# Patient Record
Sex: Female | Born: 1945
Health system: Southern US, Community
[De-identification: ages and names within clinical notes are randomized; demographics above are authoritative.]

## PROBLEM LIST (undated history)

## (undated) DIAGNOSIS — M1990 Unspecified osteoarthritis, unspecified site: Secondary | ICD-10-CM

## (undated) DIAGNOSIS — E785 Hyperlipidemia, unspecified: Secondary | ICD-10-CM

## (undated) DIAGNOSIS — E039 Hypothyroidism, unspecified: Secondary | ICD-10-CM

## (undated) DIAGNOSIS — K219 Gastro-esophageal reflux disease without esophagitis: Secondary | ICD-10-CM

## (undated) DIAGNOSIS — H269 Unspecified cataract: Secondary | ICD-10-CM

## (undated) DIAGNOSIS — Z860100 Personal history of colon polyps, unspecified: Secondary | ICD-10-CM

## (undated) DIAGNOSIS — R0602 Shortness of breath: Secondary | ICD-10-CM

## (undated) DIAGNOSIS — I10 Essential (primary) hypertension: Secondary | ICD-10-CM

## (undated) HISTORY — DX: Unspecified cataract: H26.9

## (undated) HISTORY — PX: FOOT NEUROMA SURGERY: SHX646

## (undated) HISTORY — DX: Hyperlipidemia, unspecified: E78.5

## (undated) HISTORY — DX: Personal history of colon polyps, unspecified: Z86.0100

## (undated) HISTORY — PX: TUBAL LIGATION: SHX77

## (undated) HISTORY — PX: FRACTURE SURGERY: SHX138

## (undated) HISTORY — PX: CHOLECYSTECTOMY: SHX55

## (undated) HISTORY — DX: Personal history of colonic polyps: Z86.010

---

## 1997-09-29 ENCOUNTER — Other Ambulatory Visit: Admission: RE | Admit: 1997-09-29 | Discharge: 1997-09-29 | Payer: Self-pay | Admitting: Gynecology

## 2000-04-18 ENCOUNTER — Ambulatory Visit (HOSPITAL_BASED_OUTPATIENT_CLINIC_OR_DEPARTMENT_OTHER): Admission: RE | Admit: 2000-04-18 | Discharge: 2000-04-18 | Payer: Self-pay | Admitting: General Surgery

## 2000-04-18 ENCOUNTER — Encounter (INDEPENDENT_AMBULATORY_CARE_PROVIDER_SITE_OTHER): Payer: Self-pay | Admitting: Specialist

## 2000-11-06 ENCOUNTER — Other Ambulatory Visit: Admission: RE | Admit: 2000-11-06 | Discharge: 2000-11-06 | Payer: Self-pay | Admitting: Gynecology

## 2001-07-30 ENCOUNTER — Emergency Department (HOSPITAL_COMMUNITY): Admission: EM | Admit: 2001-07-30 | Discharge: 2001-07-30 | Payer: Self-pay | Admitting: Emergency Medicine

## 2004-02-17 ENCOUNTER — Ambulatory Visit (HOSPITAL_COMMUNITY): Admission: RE | Admit: 2004-02-17 | Discharge: 2004-02-17 | Payer: Self-pay | Admitting: Chiropractic Medicine

## 2005-10-08 ENCOUNTER — Encounter (INDEPENDENT_AMBULATORY_CARE_PROVIDER_SITE_OTHER): Payer: Self-pay | Admitting: *Deleted

## 2005-11-18 ENCOUNTER — Ambulatory Visit: Payer: Self-pay | Admitting: Internal Medicine

## 2005-12-02 ENCOUNTER — Encounter (INDEPENDENT_AMBULATORY_CARE_PROVIDER_SITE_OTHER): Payer: Self-pay | Admitting: *Deleted

## 2005-12-02 ENCOUNTER — Ambulatory Visit: Payer: Self-pay | Admitting: Internal Medicine

## 2008-12-31 ENCOUNTER — Ambulatory Visit: Payer: Self-pay | Admitting: Internal Medicine

## 2008-12-31 DIAGNOSIS — K219 Gastro-esophageal reflux disease without esophagitis: Secondary | ICD-10-CM

## 2008-12-31 DIAGNOSIS — E669 Obesity, unspecified: Secondary | ICD-10-CM

## 2008-12-31 DIAGNOSIS — K589 Irritable bowel syndrome without diarrhea: Secondary | ICD-10-CM | POA: Insufficient documentation

## 2008-12-31 LAB — CONVERTED CEMR LAB: Tissue Transglutaminase Ab, IgA: 0.5 units (ref <7)

## 2009-01-07 ENCOUNTER — Ambulatory Visit: Payer: Self-pay | Admitting: Internal Medicine

## 2009-01-07 ENCOUNTER — Encounter: Payer: Self-pay | Admitting: Internal Medicine

## 2009-01-09 ENCOUNTER — Encounter: Payer: Self-pay | Admitting: Internal Medicine

## 2009-01-13 ENCOUNTER — Encounter: Payer: Self-pay | Admitting: Internal Medicine

## 2009-02-18 ENCOUNTER — Ambulatory Visit: Payer: Self-pay | Admitting: Internal Medicine

## 2009-02-18 DIAGNOSIS — Z860100 Personal history of colon polyps, unspecified: Secondary | ICD-10-CM | POA: Insufficient documentation

## 2010-12-29 ENCOUNTER — Encounter: Payer: Self-pay | Admitting: Internal Medicine

## 2011-04-25 ENCOUNTER — Other Ambulatory Visit: Payer: Self-pay | Admitting: Chiropractor

## 2011-04-25 DIAGNOSIS — M545 Low back pain: Secondary | ICD-10-CM

## 2011-04-26 ENCOUNTER — Ambulatory Visit
Admission: RE | Admit: 2011-04-26 | Discharge: 2011-04-26 | Disposition: A | Discharge status: Home / Self Care | Payer: BC Managed Care – PPO | Source: Ambulatory Visit | Attending: Chiropractor | Admitting: Chiropractor

## 2011-04-26 DIAGNOSIS — M545 Low back pain, unspecified: Secondary | ICD-10-CM

## 2011-06-22 ENCOUNTER — Encounter (HOSPITAL_COMMUNITY): Payer: Self-pay | Admitting: Pharmacy Technician

## 2011-06-27 ENCOUNTER — Other Ambulatory Visit: Payer: Self-pay

## 2011-06-27 ENCOUNTER — Encounter (HOSPITAL_COMMUNITY)
Admission: RE | Admit: 2011-06-27 | Discharge: 2011-06-27 | Disposition: A | Discharge status: Home / Self Care | Payer: BC Managed Care – PPO | Source: Ambulatory Visit | Attending: Orthopaedic Surgery | Admitting: Orthopaedic Surgery

## 2011-06-27 ENCOUNTER — Other Ambulatory Visit (HOSPITAL_COMMUNITY): Payer: Self-pay | Admitting: Orthopaedic Surgery

## 2011-06-27 ENCOUNTER — Encounter (HOSPITAL_COMMUNITY): Payer: Self-pay

## 2011-06-27 ENCOUNTER — Encounter (HOSPITAL_COMMUNITY)
Admission: RE | Admit: 2011-06-27 | Discharge: 2011-06-27 | Disposition: A | Discharge status: Home / Self Care | Payer: BC Managed Care – PPO | Source: Ambulatory Visit | Attending: Anesthesiology | Admitting: Anesthesiology

## 2011-06-27 HISTORY — DX: Gastro-esophageal reflux disease without esophagitis: K21.9

## 2011-06-27 HISTORY — DX: Shortness of breath: R06.02

## 2011-06-27 HISTORY — DX: Hypothyroidism, unspecified: E03.9

## 2011-06-27 HISTORY — DX: Unspecified osteoarthritis, unspecified site: M19.90

## 2011-06-27 HISTORY — DX: Essential (primary) hypertension: I10

## 2011-06-27 LAB — BASIC METABOLIC PANEL
BUN: 16 mg/dL (ref 6–23)
CO2: 26 mEq/L (ref 19–32)
Chloride: 103 mEq/L (ref 96–112)
Creatinine, Ser: 0.89 mg/dL (ref 0.50–1.10)
Sodium: 142 mEq/L (ref 135–145)

## 2011-06-27 LAB — SURGICAL PCR SCREEN: Staphylococcus aureus: POSITIVE — AB

## 2011-06-27 LAB — CBC
Hemoglobin: 14.8 g/dL (ref 12.0–15.0)
MCV: 87.8 fL (ref 78.0–100.0)
WBC: 8.7 10*3/uL (ref 4.0–10.5)

## 2011-06-27 NOTE — Pre-Procedure Instructions (Signed)
20 CHRISTENE POUNDS  06/27/2011   Your procedure is scheduled on:  06/29/2011  Report to [facility] at 11:15 AM.  Call this number if you have problems the morning of surgery: [phone]   Remember:   Do not eat food:After Midnight.  May have clear liquids: up to 4 Hours before arrival.  Clear liquids include soda, tea, black coffee, apple or grape juice, broth.  Take these medicines the morning of surgery with A SIP OF WATER:   SYNTHROID & PRILOSEC   Do not wear jewelry, make-up or nail polish.  Do not wear lotions, powders, or perfumes. You may wear deodorant.  Do not shave 48 hours prior to surgery.  Do not bring valuables to the hospital.  Contacts, dentures or bridgework may not be worn into surgery.  Leave suitcase in the car. After surgery it may be brought to your room.  For patients admitted to the hospital, checkout time is 11:00 AM the day of discharge.   Patients discharged the day of surgery will not be allowed to drive home.  Name and phone number of your driver:       With SPOUSE  Special Instructions: CHG Shower Use Special Wash: 1/2 bottle night before surgery and 1/2 bottle morning of surgery.   Please read over the following fact sheets that you were given: Pain Booklet, Coughing and Deep Breathing, MRSA Information and Surgical Site Infection Prevention

## 2011-06-27 NOTE — Progress Notes (Signed)
Pt. Reports that she might have had an ekg at [facility] In [place] in the past 2 yrs.

## 2011-06-27 NOTE — Progress Notes (Signed)
Requested ekg fr. [facility] - done 2011

## 2011-06-28 MED ORDER — CEFAZOLIN SODIUM-DEXTROSE 2-3 GM-% IV SOLR
2.0000 g | INTRAVENOUS | Status: AC
  Administered 2011-06-29: 2 g via INTRAVENOUS
  Filled 2011-06-28: qty 50

## 2011-06-28 NOTE — Consult Note (Signed)
Anesthesia:  Patient is a 66 year old female scheduled for a right L5-S1 microdiskectomy on 06/29/11.  History includes HTN, hypothyroidism, GERD, arthritis.  Non-smoker.  Labs and CXR acceptable.  EKG from 06/27/11 shows NSR, LAD, low voltage QRS, possible anterolateral infarct (age undetermined). We received a comparison EKG from [facility] in [place] from 04/01/10.  Overall, I think the EKG is stable.  No CV symptoms were reported at PAT.   I do not see that she has had any prior cardiac testing.   If no new symptoms on the day of surgery then anticipate she can proceed.  Reviewed with Anesthesiologist Dr. Noreene Larsson who agrees.

## 2011-06-29 ENCOUNTER — Encounter (HOSPITAL_COMMUNITY): Payer: Self-pay | Admitting: *Deleted

## 2011-06-29 ENCOUNTER — Ambulatory Visit (HOSPITAL_COMMUNITY)
Admission: RE | Admit: 2011-06-29 | Discharge: 2011-06-30 | Disposition: A | Discharge status: Home / Self Care | Payer: BC Managed Care – PPO | Source: Ambulatory Visit | Attending: Orthopaedic Surgery | Admitting: Orthopaedic Surgery

## 2011-06-29 ENCOUNTER — Ambulatory Visit (HOSPITAL_COMMUNITY): Payer: BC Managed Care – PPO

## 2011-06-29 ENCOUNTER — Ambulatory Visit (HOSPITAL_COMMUNITY): Payer: BC Managed Care – PPO | Admitting: Vascular Surgery

## 2011-06-29 ENCOUNTER — Encounter (HOSPITAL_COMMUNITY)
Admission: RE | Disposition: A | Discharge status: Home / Self Care | Payer: Self-pay | Source: Ambulatory Visit | Attending: Orthopaedic Surgery

## 2011-06-29 ENCOUNTER — Encounter (HOSPITAL_COMMUNITY): Payer: Self-pay | Admitting: Vascular Surgery

## 2011-06-29 DIAGNOSIS — Z01818 Encounter for other preprocedural examination: Secondary | ICD-10-CM | POA: Insufficient documentation

## 2011-06-29 DIAGNOSIS — I10 Essential (primary) hypertension: Secondary | ICD-10-CM | POA: Insufficient documentation

## 2011-06-29 DIAGNOSIS — Z01812 Encounter for preprocedural laboratory examination: Secondary | ICD-10-CM | POA: Insufficient documentation

## 2011-06-29 DIAGNOSIS — Z7982 Long term (current) use of aspirin: Secondary | ICD-10-CM | POA: Insufficient documentation

## 2011-06-29 DIAGNOSIS — Z79899 Other long term (current) drug therapy: Secondary | ICD-10-CM | POA: Insufficient documentation

## 2011-06-29 DIAGNOSIS — K219 Gastro-esophageal reflux disease without esophagitis: Secondary | ICD-10-CM | POA: Insufficient documentation

## 2011-06-29 DIAGNOSIS — M5126 Other intervertebral disc displacement, lumbar region: Secondary | ICD-10-CM | POA: Diagnosis present

## 2011-06-29 DIAGNOSIS — M4120 Other idiopathic scoliosis, site unspecified: Secondary | ICD-10-CM | POA: Insufficient documentation

## 2011-06-29 DIAGNOSIS — R0602 Shortness of breath: Secondary | ICD-10-CM | POA: Insufficient documentation

## 2011-06-29 DIAGNOSIS — Z01810 Encounter for preprocedural cardiovascular examination: Secondary | ICD-10-CM | POA: Insufficient documentation

## 2011-06-29 DIAGNOSIS — E039 Hypothyroidism, unspecified: Secondary | ICD-10-CM | POA: Insufficient documentation

## 2011-06-29 HISTORY — PX: LUMBAR LAMINECTOMY: SHX95

## 2011-06-29 SURGERY — MICRODISCECTOMY LUMBAR LAMINECTOMY
Anesthesia: General | Site: Back | Laterality: Right | Wound class: Clean

## 2011-06-29 MED ORDER — KETOROLAC TROMETHAMINE 30 MG/ML IJ SOLN
INTRAMUSCULAR | Status: AC
  Filled 2011-06-29: qty 1

## 2011-06-29 MED ORDER — DOCUSATE SODIUM 100 MG PO CAPS
100.0000 mg | ORAL_CAPSULE | Freq: Two times a day (BID) | ORAL | Status: DC
  Administered 2011-06-29: 100 mg via ORAL
  Filled 2011-06-29 (×3): qty 1

## 2011-06-29 MED ORDER — HYDROCODONE-ACETAMINOPHEN 5-325 MG PO TABS: 1.0000 | ORAL_TABLET | ORAL | Status: DC | PRN

## 2011-06-29 MED ORDER — MIDAZOLAM HCL 5 MG/5ML IJ SOLN
INTRAMUSCULAR | Status: DC | PRN
  Administered 2011-06-29: 2 mg via INTRAVENOUS

## 2011-06-29 MED ORDER — OXYCODONE-ACETAMINOPHEN 5-325 MG PO TABS
1.0000 | ORAL_TABLET | ORAL | Status: DC | PRN
  Administered 2011-06-29 – 2011-06-30 (×3): 2 via ORAL
  Filled 2011-06-29 (×3): qty 2

## 2011-06-29 MED ORDER — LEVOTHYROXINE SODIUM 50 MCG PO TABS
50.0000 ug | ORAL_TABLET | Freq: Every day | ORAL | Status: DC
  Filled 2011-06-29 (×2): qty 1

## 2011-06-29 MED ORDER — SENNOSIDES-DOCUSATE SODIUM 8.6-50 MG PO TABS: 1.0000 | ORAL_TABLET | Freq: Every evening | ORAL | Status: DC | PRN

## 2011-06-29 MED ORDER — KETOROLAC TROMETHAMINE 30 MG/ML IJ SOLN
30.0000 mg | Freq: Three times a day (TID) | INTRAMUSCULAR | Status: AC
  Administered 2011-06-29 – 2011-06-30 (×3): 30 mg via INTRAVENOUS
  Filled 2011-06-29 (×5): qty 1

## 2011-06-29 MED ORDER — METHOCARBAMOL 500 MG PO TABS
500.0000 mg | ORAL_TABLET | Freq: Four times a day (QID) | ORAL | Status: DC | PRN
  Administered 2011-06-29 – 2011-06-30 (×2): 500 mg via ORAL
  Filled 2011-06-29 (×2): qty 1

## 2011-06-29 MED ORDER — NEOSTIGMINE METHYLSULFATE 1 MG/ML IJ SOLN
INTRAMUSCULAR | Status: DC | PRN
  Administered 2011-06-29: 3 mg via INTRAVENOUS

## 2011-06-29 MED ORDER — KCL IN DEXTROSE-NACL 20-5-0.45 MEQ/L-%-% IV SOLN
INTRAVENOUS | Status: DC
  Administered 2011-06-30: 01:00:00 via INTRAVENOUS
  Filled 2011-06-29 (×3): qty 1000

## 2011-06-29 MED ORDER — PHENOL 1.4 % MT LIQD
1.0000 | OROMUCOSAL | Status: DC | PRN
  Filled 2011-06-29: qty 177

## 2011-06-29 MED ORDER — MORPHINE SULFATE 4 MG/ML IJ SOLN: 0.0500 mg/kg | INTRAMUSCULAR | Status: DC | PRN

## 2011-06-29 MED ORDER — FENTANYL CITRATE 0.05 MG/ML IJ SOLN: 50.0000 ug | INTRAMUSCULAR | Status: DC | PRN

## 2011-06-29 MED ORDER — MORPHINE SULFATE 2 MG/ML IJ SOLN: 0.0500 mg/kg | INTRAMUSCULAR | Status: DC | PRN

## 2011-06-29 MED ORDER — ONDANSETRON HCL 4 MG/2ML IJ SOLN: 4.0000 mg | INTRAMUSCULAR | Status: DC | PRN

## 2011-06-29 MED ORDER — GLYCOPYRROLATE 0.2 MG/ML IJ SOLN
INTRAMUSCULAR | Status: DC | PRN
  Administered 2011-06-29: .5 mg via INTRAVENOUS

## 2011-06-29 MED ORDER — BISACODYL 10 MG RE SUPP: 10.0000 mg | Freq: Every day | RECTAL | Status: DC | PRN

## 2011-06-29 MED ORDER — ACETAMINOPHEN 650 MG RE SUPP: 650.0000 mg | RECTAL | Status: DC | PRN

## 2011-06-29 MED ORDER — ASPIRIN EC 81 MG PO TBEC
81.0000 mg | DELAYED_RELEASE_TABLET | Freq: Every day | ORAL | Status: DC
  Administered 2011-06-29: 81 mg via ORAL
  Filled 2011-06-29 (×2): qty 1

## 2011-06-29 MED ORDER — ZOLPIDEM TARTRATE 5 MG PO TABS: 5.0000 mg | ORAL_TABLET | Freq: Every evening | ORAL | Status: DC | PRN

## 2011-06-29 MED ORDER — MENTHOL 3 MG MT LOZG: 1.0000 | LOZENGE | OROMUCOSAL | Status: DC | PRN

## 2011-06-29 MED ORDER — SODIUM CHLORIDE 0.9 % IJ SOLN
3.0000 mL | INTRAMUSCULAR | Status: DC | PRN
  Administered 2011-06-30: 3 mL via INTRAVENOUS

## 2011-06-29 MED ORDER — CEFAZOLIN SODIUM 1-5 GM-% IV SOLN
1.0000 g | Freq: Three times a day (TID) | INTRAVENOUS | Status: AC
  Administered 2011-06-30 (×2): 1 g via INTRAVENOUS
  Filled 2011-06-29 (×2): qty 50

## 2011-06-29 MED ORDER — SODIUM CHLORIDE 0.9 % IJ SOLN: 3.0000 mL | Freq: Two times a day (BID) | INTRAMUSCULAR | Status: DC

## 2011-06-29 MED ORDER — BENAZEPRIL HCL 10 MG PO TABS
10.0000 mg | ORAL_TABLET | Freq: Every day | ORAL | Status: DC
  Filled 2011-06-29: qty 1

## 2011-06-29 MED ORDER — ONDANSETRON HCL 4 MG/2ML IJ SOLN
INTRAMUSCULAR | Status: DC | PRN
  Administered 2011-06-29: 4 mg via INTRAVENOUS

## 2011-06-29 MED ORDER — HYDROMORPHONE HCL PF 1 MG/ML IJ SOLN
INTRAMUSCULAR | Status: AC
  Administered 2011-06-29: 0.5 mg via INTRAVENOUS
  Filled 2011-06-29: qty 1

## 2011-06-29 MED ORDER — FENTANYL CITRATE 0.05 MG/ML IJ SOLN
INTRAMUSCULAR | Status: AC
  Filled 2011-06-29: qty 2

## 2011-06-29 MED ORDER — MEPERIDINE HCL 25 MG/ML IJ SOLN: 6.2500 mg | INTRAMUSCULAR | Status: DC | PRN

## 2011-06-29 MED ORDER — PROPOFOL 10 MG/ML IV EMUL
INTRAVENOUS | Status: DC | PRN
  Administered 2011-06-29: 200 mg via INTRAVENOUS

## 2011-06-29 MED ORDER — PROMETHAZINE HCL 25 MG/ML IJ SOLN
INTRAMUSCULAR | Status: AC
  Administered 2011-06-29: 12.5 mg
  Filled 2011-06-29: qty 1

## 2011-06-29 MED ORDER — DEXTROSE 5 % IV SOLN
INTRAVENOUS | Status: DC | PRN
  Administered 2011-06-29: 13:00:00 via INTRAVENOUS

## 2011-06-29 MED ORDER — LACTATED RINGERS IV SOLN
INTRAVENOUS | Status: DC
  Administered 2011-06-29: 13:00:00 via INTRAVENOUS

## 2011-06-29 MED ORDER — ALUM & MAG HYDROXIDE-SIMETH 200-200-20 MG/5ML PO SUSP: 30.0000 mL | Freq: Four times a day (QID) | ORAL | Status: DC | PRN

## 2011-06-29 MED ORDER — ONDANSETRON HCL 4 MG/2ML IJ SOLN
INTRAMUSCULAR | Status: AC
  Filled 2011-06-29: qty 2

## 2011-06-29 MED ORDER — ONDANSETRON HCL 4 MG/2ML IJ SOLN
4.0000 mg | Freq: Once | INTRAMUSCULAR | Status: AC | PRN
  Administered 2011-06-29: 4 mg via INTRAVENOUS

## 2011-06-29 MED ORDER — ROCURONIUM BROMIDE 100 MG/10ML IV SOLN
INTRAVENOUS | Status: DC | PRN
  Administered 2011-06-29: 50 mg via INTRAVENOUS

## 2011-06-29 MED ORDER — SODIUM CHLORIDE 0.9 % IV SOLN: 250.0000 mL | INTRAVENOUS | Status: DC

## 2011-06-29 MED ORDER — MORPHINE SULFATE 4 MG/ML IJ SOLN
1.0000 mg | INTRAMUSCULAR | Status: DC | PRN
  Administered 2011-06-30: 2 mg via INTRAVENOUS
  Filled 2011-06-29: qty 1

## 2011-06-29 MED ORDER — AMLODIPINE BESYLATE 5 MG PO TABS
5.0000 mg | ORAL_TABLET | Freq: Every day | ORAL | Status: DC
  Filled 2011-06-29: qty 1

## 2011-06-29 MED ORDER — LACTATED RINGERS IV SOLN
INTRAVENOUS | Status: DC | PRN
  Administered 2011-06-29 (×2): via INTRAVENOUS

## 2011-06-29 MED ORDER — PANTOPRAZOLE SODIUM 40 MG PO TBEC
40.0000 mg | DELAYED_RELEASE_TABLET | Freq: Every day | ORAL | Status: DC
  Administered 2011-06-29: 40 mg via ORAL
  Filled 2011-06-29: qty 1

## 2011-06-29 MED ORDER — SUFENTANIL CITRATE 50 MCG/ML IV SOLN
INTRAVENOUS | Status: DC | PRN
  Administered 2011-06-29: 20 ug via INTRAVENOUS
  Administered 2011-06-29: 10 ug via INTRAVENOUS

## 2011-06-29 MED ORDER — HYDROMORPHONE HCL PF 1 MG/ML IJ SOLN
0.2500 mg | INTRAMUSCULAR | Status: DC | PRN
Start: 2011-06-29 — End: 2011-06-29
  Administered 2011-06-29 (×4): 0.5 mg via INTRAVENOUS

## 2011-06-29 MED ORDER — METHOCARBAMOL 100 MG/ML IJ SOLN
500.0000 mg | Freq: Four times a day (QID) | INTRAVENOUS | Status: DC | PRN
  Filled 2011-06-29: qty 5

## 2011-06-29 MED ORDER — FENTANYL CITRATE 0.05 MG/ML IJ SOLN
50.0000 ug | INTRAMUSCULAR | Status: DC | PRN
  Administered 2011-06-29: 100 ug via INTRAVENOUS

## 2011-06-29 MED ORDER — ACETAMINOPHEN 325 MG PO TABS: 650.0000 mg | ORAL_TABLET | ORAL | Status: DC | PRN

## 2011-06-29 MED ORDER — AMLODIPINE BESY-BENAZEPRIL HCL 5-10 MG PO CAPS: 1.0000 | ORAL_CAPSULE | ORAL | Status: DC

## 2011-06-29 SURGICAL SUPPLY — 46 items
BENZOIN TINCTURE PRP APPL 2/3 (GAUZE/BANDAGES/DRESSINGS) ×2 IMPLANT
BUR ROUND FLUTED 4 SOFT TCH (BURR) IMPLANT
CLOSURE STERI STRIP 1/2 X4 (GAUZE/BANDAGES/DRESSINGS) ×2 IMPLANT
CLOTH BEACON ORANGE TIMEOUT ST (SAFETY) ×2 IMPLANT
CORDS BIPOLAR (ELECTRODE) ×2 IMPLANT
COVER SURGICAL LIGHT HANDLE (MISCELLANEOUS) ×2 IMPLANT
DRAPE MICROSCOPE LEICA (MISCELLANEOUS) ×2 IMPLANT
DRAPE PROXIMA HALF (DRAPES) ×4 IMPLANT
DRSG EMULSION OIL 3X3 NADH (GAUZE/BANDAGES/DRESSINGS) ×2 IMPLANT
DURAPREP 26ML APPLICATOR (WOUND CARE) ×2 IMPLANT
ELECT REM PT RETURN 9FT ADLT (ELECTROSURGICAL) ×2
ELECTRODE REM PT RTRN 9FT ADLT (ELECTROSURGICAL) ×1 IMPLANT
GLOVE BIOGEL PI IND STRL 7.5 (GLOVE) ×1 IMPLANT
GLOVE BIOGEL PI IND STRL 8 (GLOVE) ×1 IMPLANT
GLOVE BIOGEL PI INDICATOR 7.5 (GLOVE) ×1
GLOVE BIOGEL PI INDICATOR 8 (GLOVE) ×1
GLOVE ECLIPSE 7.0 STRL STRAW (GLOVE) ×2 IMPLANT
GLOVE ORTHO TXT STRL SZ7.5 (GLOVE) ×2 IMPLANT
GOWN PREVENTION PLUS LG XLONG (DISPOSABLE) IMPLANT
GOWN STRL NON-REIN LRG LVL3 (GOWN DISPOSABLE) ×6 IMPLANT
KIT BASIN OR (CUSTOM PROCEDURE TRAY) ×2 IMPLANT
KIT ROOM TURNOVER OR (KITS) ×2 IMPLANT
MANIFOLD NEPTUNE II (INSTRUMENTS) ×2 IMPLANT
NDL SUT .5 MAYO 1.404X.05X (NEEDLE) ×1 IMPLANT
NEEDLE HYPO 25GX1X1/2 BEV (NEEDLE) ×2 IMPLANT
NEEDLE MAYO TAPER (NEEDLE) ×1
NEEDLE SPNL 18GX3.5 QUINCKE PK (NEEDLE) ×2 IMPLANT
NS IRRIG 1000ML POUR BTL (IV SOLUTION) ×2 IMPLANT
PACK LAMINECTOMY ORTHO (CUSTOM PROCEDURE TRAY) ×2 IMPLANT
PAD ARMBOARD 7.5X6 YLW CONV (MISCELLANEOUS) ×4 IMPLANT
PATTIES SURGICAL .5 X.5 (GAUZE/BANDAGES/DRESSINGS) ×2 IMPLANT
PATTIES SURGICAL .75X.75 (GAUZE/BANDAGES/DRESSINGS) IMPLANT
SPONGE GAUZE 4X4 12PLY (GAUZE/BANDAGES/DRESSINGS) ×2 IMPLANT
STAPLER VISISTAT 35W (STAPLE) IMPLANT
STRIP CLOSURE SKIN 1/2X4 (GAUZE/BANDAGES/DRESSINGS) IMPLANT
SUT VIC AB 2-0 CT1 27 (SUTURE) ×1
SUT VIC AB 2-0 CT1 TAPERPNT 27 (SUTURE) ×1 IMPLANT
SUT VICRYL 0 TIES 12 18 (SUTURE) ×2 IMPLANT
SUT VICRYL 4-0 PS2 18IN ABS (SUTURE) IMPLANT
SUT VICRYL AB 2 0 TIES (SUTURE) ×2 IMPLANT
SYR 20ML ECCENTRIC (SYRINGE) IMPLANT
SYR CONTROL 10ML LL (SYRINGE) ×2 IMPLANT
TAPE CLOTH SURG 4X10 WHT LF (GAUZE/BANDAGES/DRESSINGS) ×2 IMPLANT
TOWEL OR 17X24 6PK STRL BLUE (TOWEL DISPOSABLE) ×2 IMPLANT
TOWEL OR 17X26 10 PK STRL BLUE (TOWEL DISPOSABLE) ×2 IMPLANT
WATER STERILE IRR 1000ML POUR (IV SOLUTION) ×2 IMPLANT

## 2011-06-29 NOTE — Anesthesia Preprocedure Evaluation (Addendum)
Anesthesia Evaluation  Patient identified by MRN, date of birth, ID band Patient awake    Reviewed: Allergy & Precautions, H&P , NPO status , Patient's Chart, lab work & pertinent test results, reviewed documented beta blocker date and time   Airway Mallampati: I TM Distance: >3 FB     Dental  (+) Teeth Intact and Dental Advisory Given   Pulmonary  clear to auscultation        Cardiovascular Normal    Neuro/Psych    GI/Hepatic   Endo/Other    Renal/GU      Musculoskeletal   Abdominal   Peds  Hematology   Anesthesia Other Findings   Reproductive/Obstetrics                           Anesthesia Physical Anesthesia Plan  ASA: II  Anesthesia Plan: General   Post-op Pain Management:    Induction: Intravenous  Airway Management Planned: Oral ETT  Additional Equipment:   Intra-op Plan:   Post-operative Plan: Extubation in OR  Informed Consent: I have reviewed the patients History and Physical, chart, labs and discussed the procedure including the risks, benefits and alternatives for the proposed anesthesia with the patient or authorized representative who has indicated his/her understanding and acceptance.   Dental advisory given  Plan Discussed with: CRNA, Anesthesiologist and Surgeon  Anesthesia Plan Comments:         Anesthesia Quick Evaluation

## 2011-06-29 NOTE — Transfer of Care (Signed)
Immediate Anesthesia Transfer of Care Note  Patient: Cynthia Werner  Procedure(s) Performed:  MICRODISCECTOMY LUMBAR LAMINECTOMY - Right L5-S1 Microdiscectomy  Patient Location: PACU  Anesthesia Type: General  Level of Consciousness: awake, alert  and oriented  Airway & Oxygen Therapy: Patient Spontanous Breathing and Patient connected to nasal cannula oxygen  Post-op Assessment: Report given to PACU RN, Post -op Vital signs reviewed and stable and Patient moving all extremities  Post vital signs: Reviewed and stable  Complications: No apparent anesthesia complications

## 2011-06-29 NOTE — H&P (Signed)
A Division of [facility]  [address] [place], [place] [address] Telephone: [phone]  Fax: [phone]    PATIENT: Cynthia Werner, Cynthia Werner  MR#: [id number]  Visit Date: 06/21/2011    HISTORY OF PRESENT ILLNESS:  Patient referred by Dr. Alvester Morin for evaluation of back pain, right leg pain, right leg weakness and right leg giving away.  Patient states she has had significant problems since September.  She has had problems like this likely about 10 years ago, but was not as severe as what she is currently experiencing and it resolved.  She states she has always had some back pain with some dull aching.  She has had some known scoliosis.  She denies any problems with her back when she was a child, but states pretty much all of her adult life, she has had some aching in her back, but the pain she is having now is severe.  She has been seen by a couple of chiropractors.  She has had an MRI scan that shows focal disk herniation on the right at L5-S1 with L5 nerve root displacement.  She is not able to stand very long.  She has pain when she walks from one room to the other.  It takes 5-10 minutes of supine position for her to get better.  She has tried different medications, including prednisone, tramadol, she has had epidural steroid injections, ibuprofen, hydrocodone and states that nothing really helps.  She did feel like she could move slightly better for a period of time shortly after the injection.  Patient has pain that radiates down to the dorsum of her right foot, none on the left side.  She is normally followed by [facility], Lonie Peak, [place] and Dr. Nathanial Rancher, M.D.   CURRENT MEDICATIONS:  She is omeprazole 20 mg, amlodipine, benazepril 50/10, chlordiazepoxide 1 p.o. daily, 1 aspirin a day, omega 3 fatty acids and vitamin D3.   ALLERGIES:  She has no known drug allergies.   PAST MEDICAL/SURGICAL HISTORY:  Previous gallbladder surgery in the 1990s.   SOCIAL  HISTORY:  Patient is married to her husband, Cynthia Werner.  She does not smoke or drink.   FAMILY HISTORY:  Positive for diabetes, heart disease and hypertension.   REVIEW OF SYSTEMS:  Fourteen point review of systems positive for acid reflux and hypertension.   PHYSICAL EXAMINATION:  Patient has difficulty getting from sitting to standing position.  She is unable to toe walk.  She cannot do a single stance toe raise on the right.  She can do them on the left side.  Anterior tib is weak.  EHL is weak.  I am able to overcome both with 2 finger pressure on the right side.  On the left, EHL, anterior tib and gastrocsoleus test normal.  She has decreased sensation dorsum of her foot.  Positive straight leg raising at 90 degrees.  Patient is alert and oriented.  WD.  WN.  NAD.  Extraocular movements intact.  Good visual acuity with glasses.  No rash over exposed skin.  BP 134/60, pulse is 80.  No accessory muscle inspiratory effort.     RADIOGRAPHS/TEST:  MRI scan is reviewed with patient, which shows far lateral protrusion at L5-S1 with L5 nerve root compression.  MRI scan shows mild facet arthropathy up to 4-5 without compression.  At 5-1, shows far lateral protrusion on the right adjacent L5 nerve root.     ASSESSMENT/DIAGNOSIS:  L5-S1 right lateral HNP.  Patient states that  her life has been miserable.  Christmas and Thanksgiving are terrible.  She cannot stand.  She has times when she cannot stand on her own.  Someone has to hang onto her so that she does not fall.  I discussed with patient and her husband, looking at spine models and options.     PLAN:  Will obtain some plain radiographs AP, lateral and obliques.  They would like to proceed with operative intervention due to chronic pain that she is having, failed chiropractic treatment, exercise regimen, anti-inflammatories, prednisone, narcotic pain medication with leg giving away and recent falling episodes that she has had in the last 10 days.  Questions  answered and we discussed possibility of repeat surgery, midline exposure.  I gave the patient and her husband a copy of the MRI scan and report.  Failed epidurals and would like to proceed with operative intervention.  Patient has had greater than 6 months of symptoms with increasing weakness, falling, failed conservative treatment including chiropractic treatments with 2 different chiropractors, epidural steroid injections x2.  All questions answered.  Risks of surgery discussed including repeat disk rupture.   For additional information please see handwritten notes, reports, orders and prescriptions in this chart.      Jabron Weese C. Ophelia Charter, M.D.    Auto-Authenticated by Veverly Fells. Ophelia Charter, M.D.  MCY/ac DD: 06/21/2011  DT: 06/23/2011   cc: Jannifer Franklin. Alvester Morin, MD   Lonie Peak, PA-C([employer])

## 2011-06-29 NOTE — Interval H&P Note (Signed)
History and Physical Interval Note:  06/29/2011 12:51 PM  Cynthia Werner  has presented today for surgery, with the diagnosis of Right L5-S1 HNP  The various methods of treatment have been discussed with the patient and family. After consideration of risks, benefits and other options for treatment, the patient has consented to  Procedure(s): MICRODISCECTOMY LUMBAR LAMINECTOMY as a surgical intervention .  The patients' history has been reviewed, patient examined, no change in status, stable for surgery.  I have reviewed the patients' chart and labs.  Questions were answered to the patient's satisfaction.     YATES,MARK C  Lungs CTA, Heart RR and R.  No M or G.  Abd S/NT.   Right S1 weakness. Meds reviewed.

## 2011-06-29 NOTE — Brief Op Note (Signed)
06/29/2011  2:33 PM  PATIENT:  Cynthia Werner  66 y.o. female  PRE-OPERATIVE DIAGNOSIS:  Right L5-S1 herniated nulceus pulposus.  POST-OPERATIVE DIAGNOSIS:  Right L5-S1 herniated nulceus pulposus.  PROCEDURE:  Procedure(s): MICRODISCECTOMY LUMBAR LAMINECTOMY Right L5-S1 SURGEON:  Surgeon(s): Eldred Manges, MD  PHYSICIAN ASSISTANT: Maud Deed PAC  ASSISTANTS: none   ANESTHESIA:   general  EBL:  Total I/O In: 1050 [I.V.:1050] Out: -   BLOOD ADMINISTERED:none  DRAINS: none   LOCAL MEDICATIONS USED:  NONE  SPECIMEN:  No Specimen  DISPOSITION OF SPECIMEN:  N/A  COUNTS:  YES  TOURNIQUET:  * No tourniquets in log *  DICTATION: .Note written in EPIC  PLAN OF CARE: Admit for overnight observation  PATIENT DISPOSITION:  PACU - hemodynamically stable.   Delay start of Pharmacological VTE agent (>24hrs) due to surgical blood loss or risk of bleeding:  {YES/NO/NOT APPLICABLE:20182

## 2011-06-29 NOTE — Anesthesia Postprocedure Evaluation (Signed)
Anesthesia Post Note  Patient: Cynthia Werner  Procedure(s) Performed:  MICRODISCECTOMY LUMBAR LAMINECTOMY - Right L5-S1 Microdiscectomy  Anesthesia type: General  Patient location: PACU  Post pain: Pain level controlled and Adequate analgesia  Post assessment: Post-op Vital signs reviewed, Patient's Cardiovascular Status Stable, Respiratory Function Stable, Patent Airway and Pain level controlled  Last Vitals:  Filed Vitals:   06/29/11 1624  BP:   Pulse:   Temp: 36.2 C  Resp:     Post vital signs: Reviewed and stable  Level of consciousness: awake, alert  and oriented  Complications: No apparent anesthesia complications

## 2011-06-30 MED ORDER — METHOCARBAMOL 500 MG PO TABS: 500.0000 mg | ORAL_TABLET | Freq: Four times a day (QID) | ORAL | Status: AC | PRN

## 2011-06-30 MED ORDER — OXYCODONE-ACETAMINOPHEN 5-325 MG PO TABS: 1.0000 | ORAL_TABLET | ORAL | Status: AC | PRN

## 2011-06-30 NOTE — Op Note (Signed)
Cynthia Werner, Cynthia Werner               ACCOUNT NO.:  [id number]  MEDICAL RECORD NO.:  [id number]  LOCATION:  5033                         FACILITY:  [facility]  PHYSICIAN:  Shalay Carder C. Ophelia Charter, M.D.    DATE OF BIRTH:  01-Jan-1946  DATE OF PROCEDURE:  06/29/2011 DATE OF DISCHARGE:                              OPERATIVE REPORT   PREOPERATIVE DIAGNOSIS:  Right foraminal extraforaminal L5-S1 herniated nucleus pulposus.  POSTOPERATIVE DIAGNOSIS:  Right foraminal extraforaminal L5-S1 herniated nucleus pulposus.  PROCEDURE:  Right L5-S1 microdiskectomy, removal of herniated nucleus pulposus.  SURGEON:  Hansel Devan C. Ophelia Charter, MD  ANESTHESIA:  GOT plus Marcaine local.  ASSISTANT:  Wende Neighbors, PA-C, medically necessary and present for the entire procedure.  ESTIMATED BLOOD LOSS:  Minimal.  SPECIMEN:  None.  BRIEF HISTORY:  This patient has persistent problems with back pain, right leg pain, right leg weakness, gastrocsoleus weakness, failed conservative treatment including anti-inflammatories several months conservative treatment, and epidural steroid injections.  She has failed to respond epidurals, had progressive radiating pain S1 distribution with weakness and was referred to me by Dr. Naaman Plummer after failed epidurals for operative intervention.  The patient had nerve root compression laterally with far lateral L5-S1 HNP with L5 nerve root displacement.  PROCEDURE:  After induction of general anesthesia, orotracheal intubation, preoperative antibiotics, the patient was placed prone on chest rolls careful padding, shoulder pads, arms at 90-90 with pads over the ulnar nerve.  Back was prepped with DuraPrep.  Area squared with towels.  Betadine, Steri-Drape application and laminectomy sheet and drapes.  Needle was placed based on palpable landmarks.  Turned out to be just inferior to the L5-S1 interspace with cross table lateral.  A small incision was made, more cephalad and then 2-3 mm off  the midline on the right side.  Subperiosteal dissection out over the facet and the [facility] retractor placed laterally, 5 pounds weight.  Laminotomy was performed at L5-S1 interspace.  A #4 Penfield was placed and second x- ray was taken confirming this was the correct L5-S1 level.  Operative microscope was draped, brought in.  Ligamentum was removed. Foraminotomy was performed.  Bone was removed out to the level of the pedicle.  Disk was bulging slightly.  Annulus was incised.  Passes were made with micropituitary.  There was an empty space present with initially reaching into the disk.  Minimal disk material was present. Hockey stick was used to push out laterally underneath the lateral ligament, pushing down the chunks of disks were pushed in the middle of the disk and they were removed with a straight micropituitary straight, down micropituitary, down regular and up from the opposite side. Micropituitary was used by assistant, removing chunks of disk out as the nerve root and dura were carefully protected with a [employer] retractor. Nerve root was decompressed.  No fragments were down in the foramina. Passed the hockey stick into the dura gave 180 degrees sweep with no areas of compression.  Continued pushing with longest hockey stick pushing fragments down, delivering them to help decompress laterally out past the foramina.  Some additional bone was removed with partial facetectomy to allow a little bit further insertion with the hockey  stick to push the fragment away from the nerve root that was being displaced.  After adequate decompression, irrigation with saline solution, closure of fascia with 0 Vicryl, 2-0 Vicryl subcutaneous tissue, subcuticular closure.  Tincture of benzoin, Marcaine infiltration, Steri-Strips, 4x4s, and postop dressing.  Instrument count and needle count were correct.  The patient tolerated the procedure well and transferred to recovery room in stable  condition.     Hadassa Cermak C. Ophelia Charter, M.D.     MCY/MEDQ  D:  06/29/2011  T:  06/30/2011  Job:  [id number]

## 2011-06-30 NOTE — Progress Notes (Addendum)
Subjective: Tolerating po pain meds.  Out of bed to bathroom. Some back pain as expected.  Leg pain some improved.  Eating and voiding   Objective: Vital signs in last 24 hours: Temp:  [96.9 F (36.1 C)-98.4 F (36.9 C)] 97.9 F (36.6 C) (01/31 0240) Pulse Rate:  [54-73] 54  (01/31 0240) Resp:  [16-31] 20  (01/31 0240) BP: (103-136)/(39-80) 117/66 mmHg (01/31 0240) SpO2:  [92 %-99 %] 94 % (01/31 0240) Weight:  [83.915 kg (185 lb)] 83.915 kg (185 lb) (01/30 1721)  Intake/Output from previous day: 01/30 0701 - 01/31 0700 In: 1580 [P.O.:480; I.V.:1050; IV Piggyback:50] Out: 30 [Blood:30] Intake/Output this shift:     Basename 06/27/11 1030  HGB 14.8    Basename 06/27/11 1030  WBC 8.7  RBC 4.92  HCT 43.2  PLT 247    Basename 06/27/11 1030  NA 142  K 4.4  CL 103  CO2 26  BUN 16  CREATININE 0.89  GLUCOSE 97  CALCIUM 10.1   No results found for this basename: LABPT:2,INR:2 in the last 72 hours  Neurovascular intact Sensation intact distally Intact pulses distally Dorsiflexion/Plantar flexion intact Incision: dressing C/D/I  Assessment/Plan: Discharge home today OV 1 week rx for percocet and robaxin COD stable  Tou Hayner M 06/30/2011, 7:49 AM

## 2011-06-30 NOTE — Discharge Summary (Signed)
Physician Discharge Summary  Patient ID: Cynthia Werner MRN: [id number] DOB/AGE: [birth year] 66 y.o.  Admit date: 06/29/2011 Discharge date: 06/30/2011  Admission Diagnoses:  HNP (herniated nucleus pulposus), lumbar Right L5-S1 Discharge Diagnoses:  Principal Problem:  *HNP (herniated nucleus pulposus), lumbar right L5-S1  Past Medical History  Diagnosis Date  . Hypertension     stress test done 10+ yrs. ago,  done due to being on a diet pill, told that it was  wnl   . Shortness of breath   . Hypothyroidism   . GERD (gastroesophageal reflux disease)   . Arthritis     low back, HNP- L5-S1    Surgeries: Procedure(s): MICRODISCECTOMY LUMBAR LAMINECTOMY on 06/29/2011  right L5-S1 Consultants (if any):  none  Discharged Condition: Improved  Hospital Course: Cynthia Werner is an 66 y.o. female who was admitted 06/29/2011 with a diagnosis of HNP (herniated nucleus pulposus), lumbar and went to the operating room on 06/29/2011 and underwent the above named procedures.    She was given perioperative antibiotics:  Anti-infectives     Start     Dose/Rate Route Frequency Ordered Stop   06/29/11 2100   ceFAZolin (ANCEF) IVPB 1 g/50 mL premix        1 g 100 mL/hr over 30 Minutes Intravenous Every 8 hours 06/29/11 1715 06/30/11 0550   06/28/11 1507   ceFAZolin (ANCEF) IVPB 2 g/50 mL premix        2 g 100 mL/hr over 30 Minutes Intravenous 60 min pre-op 06/28/11 1507 06/29/11 1320        .  She was given sequential compression devices, early ambulation.  She benefited maximally from their hospital stay and there were no complications.    Recent vital signs:  Filed Vitals:   06/30/11 0240  BP: 117/66  Pulse: 54  Temp: 97.9 F (36.6 C)  Resp: 20    Recent laboratory studies:  Lab Results  Component Value Date   HGB 14.8 06/27/2011   Lab Results  Component Value Date   WBC 8.7 06/27/2011   PLT 247 06/27/2011   No results found for this basename: INR   Lab Results    Component Value Date   NA 142 06/27/2011   K 4.4 06/27/2011   CL 103 06/27/2011   CO2 26 06/27/2011   BUN 16 06/27/2011   CREATININE 0.89 06/27/2011   GLUCOSE 97 06/27/2011    Discharge Medications:   Medication List  As of 06/30/2011  7:57 AM   TAKE these medications         amLODipine-benazepril 5-10 MG per capsule   Commonly known as: LOTREL   Take 1 capsule by mouth every morning.      aspirin EC 81 MG tablet   Take 81 mg by mouth daily.      D3-1000 1000 UNITS capsule   Generic drug: Cholecalciferol   Take 1,000 Units by mouth daily.      HYDROcodone-acetaminophen 5-325 MG per tablet   Commonly known as: NORCO   Take 1-2 tablets by mouth every 4 (four) hours as needed. For pain      levothyroxine 50 MCG tablet   Commonly known as: SYNTHROID, LEVOTHROID   Take 50 mcg by mouth daily before breakfast.      methocarbamol 500 MG tablet   Commonly known as: ROBAXIN   Take 1 tablet (500 mg total) by mouth every 6 (six) hours as needed (spasm).      omeprazole 20 MG capsule  Commonly known as: PRILOSEC   Take 20 mg by mouth every morning.      oxyCODONE-acetaminophen 5-325 MG per tablet   Commonly known as: PERCOCET   Take 1-2 tablets by mouth every 4 (four) hours as needed.      Potassium 99 MG Tabs   Take 1 tablet by mouth daily.            Diagnostic Studies: Dg Chest 2 View  06/27/2011  *RADIOLOGY REPORT*  Clinical Data: Preop  CHEST - 2 VIEW  Comparison: None.  Findings: Cardiomediastinal silhouette is within normal limits. Lungs are clear.  No pleural effusion.  No pneumothorax.  IMPRESSION: No active cardiopulmonary disease.  Original Report Authenticated By: Donavan Burnet, M.D.   Dg Lumbar Spine 2-3 Views  06/29/2011  *RADIOLOGY REPORT*  Clinical Data: Lumbar spine level probe localization.  LUMBAR SPINE - 2-3 VIEW  Comparison: 04/26/2011  Findings: On the first image the surgical probe is posterior to the S1/S2 disc space.  On the second image there are  tissue spreaders and surgical probe identified posterior to the L5 S1 level.   IMPRESSION: Portable intraoperative radiographs obtained for lumbar spine disc space localization  Original Report Authenticated By: Rosealee Albee, M.D.    Disposition: Final discharge disposition not confirmed  Pt discharged to home  Discharge Orders    Future Orders Please Complete By Expires   Diet - low sodium heart healthy      Call MD / Call 911      Comments:   If you experience chest pain or shortness of breath, CALL 911 and be transported to the hospital emergency room.  If you develope a fever above 101 F, pus (white drainage) or increased drainage or redness at the wound, or calf pain, call your surgeon's office.   Constipation Prevention      Comments:   Drink plenty of fluids.  Prune juice may be helpful.  You may use a stool softener, such as Colace (over the counter) 100 mg twice a day.  Use MiraLax (over the counter) for constipation as needed.   Increase activity slowly as tolerated      Discharge instructions      Comments:   Walk as tolerated.  Avoid bending lifting or twisting.  Change dressing daily or as needed.  May shower starting Friday with wound covered, then redress wound.  Call office if questions   Driving restrictions      Comments:   No driving   Lifting restrictions      Comments:   No lifting      Follow-up Information    Follow up with YATES,MARK C, MD. Schedule an appointment as soon as possible for a visit in 1 week.   Contact information:   [facility] [address] [place] [address] [phone]           Signed: Wende Neighbors 06/30/2011, 7:57 AM

## 2011-07-01 ENCOUNTER — Encounter (HOSPITAL_COMMUNITY): Payer: Self-pay | Admitting: Orthopaedic Surgery

## 2012-02-28 ENCOUNTER — Encounter: Payer: Self-pay | Admitting: Internal Medicine

## 2013-09-27 ENCOUNTER — Other Ambulatory Visit: Payer: Self-pay | Admitting: Orthopaedic Surgery

## 2013-09-27 DIAGNOSIS — M545 Low back pain, unspecified: Secondary | ICD-10-CM

## 2013-10-05 ENCOUNTER — Other Ambulatory Visit: Payer: BC Managed Care – PPO

## 2013-10-09 ENCOUNTER — Ambulatory Visit
Admission: RE | Admit: 2013-10-09 | Discharge: 2013-10-09 | Disposition: A | Discharge status: Home / Self Care | Payer: Medicare Other | Source: Ambulatory Visit | Attending: Orthopaedic Surgery | Admitting: Orthopaedic Surgery

## 2013-10-09 DIAGNOSIS — M545 Low back pain, unspecified: Secondary | ICD-10-CM

## 2013-10-09 MED ORDER — GADOBENATE DIMEGLUMINE 529 MG/ML IV SOLN
17.0000 mL | Freq: Once | INTRAVENOUS | Status: AC | PRN
  Administered 2013-10-09: 17 mL via INTRAVENOUS

## 2014-09-02 ENCOUNTER — Other Ambulatory Visit: Payer: Self-pay | Admitting: Obstetrics and Gynecology

## 2014-09-02 DIAGNOSIS — R928 Other abnormal and inconclusive findings on diagnostic imaging of breast: Secondary | ICD-10-CM

## 2014-09-04 ENCOUNTER — Ambulatory Visit
Admission: RE | Admit: 2014-09-04 | Discharge: 2014-09-04 | Disposition: A | Discharge status: Home / Self Care | Payer: Medicare Other | Source: Ambulatory Visit | Attending: Obstetrics and Gynecology | Admitting: Obstetrics and Gynecology

## 2014-09-04 DIAGNOSIS — R928 Other abnormal and inconclusive findings on diagnostic imaging of breast: Secondary | ICD-10-CM

## 2014-10-08 ENCOUNTER — Encounter: Payer: Self-pay | Admitting: Internal Medicine

## 2015-01-30 ENCOUNTER — Other Ambulatory Visit: Payer: Self-pay | Admitting: Obstetrics and Gynecology

## 2015-01-30 DIAGNOSIS — R921 Mammographic calcification found on diagnostic imaging of breast: Secondary | ICD-10-CM

## 2015-02-10 ENCOUNTER — Ambulatory Visit
Admission: RE | Admit: 2015-02-10 | Discharge: 2015-02-10 | Disposition: A | Discharge status: Home / Self Care | Payer: Medicare Other | Source: Ambulatory Visit | Attending: Obstetrics and Gynecology | Admitting: Obstetrics and Gynecology

## 2015-02-10 ENCOUNTER — Other Ambulatory Visit: Payer: Self-pay | Admitting: Obstetrics and Gynecology

## 2015-02-10 DIAGNOSIS — R921 Mammographic calcification found on diagnostic imaging of breast: Secondary | ICD-10-CM

## 2015-07-29 ENCOUNTER — Other Ambulatory Visit: Payer: Self-pay | Admitting: Obstetrics and Gynecology

## 2015-07-29 DIAGNOSIS — R921 Mammographic calcification found on diagnostic imaging of breast: Secondary | ICD-10-CM

## 2015-08-31 ENCOUNTER — Ambulatory Visit
Admission: RE | Admit: 2015-08-31 | Discharge: 2015-08-31 | Disposition: A | Discharge status: Home / Self Care | Payer: Medicare Other | Source: Ambulatory Visit | Attending: Obstetrics and Gynecology | Admitting: Obstetrics and Gynecology

## 2015-08-31 DIAGNOSIS — R921 Mammographic calcification found on diagnostic imaging of breast: Secondary | ICD-10-CM

## 2015-09-09 ENCOUNTER — Other Ambulatory Visit: Payer: Self-pay | Admitting: Obstetrics & Gynecology

## 2015-09-10 LAB — CYTOLOGY - PAP

## 2015-11-16 ENCOUNTER — Encounter: Payer: Self-pay | Admitting: Internal Medicine

## 2015-12-07 ENCOUNTER — Encounter: Payer: Self-pay | Admitting: Internal Medicine

## 2016-01-28 ENCOUNTER — Encounter: Payer: Self-pay | Admitting: Internal Medicine

## 2016-01-28 ENCOUNTER — Ambulatory Visit (AMBULATORY_SURGERY_CENTER): Payer: Self-pay

## 2016-01-28 VITALS — Ht 62.0 in | Wt 162.8 lb

## 2016-01-28 DIAGNOSIS — Z860100 Personal history of colon polyps, unspecified: Secondary | ICD-10-CM

## 2016-01-28 MED ORDER — NA SULFATE-K SULFATE-MG SULF 17.5-3.13-1.6 GM/177ML PO SOLN: 1.0000 | Freq: Once | ORAL | 0 refills | Status: AC

## 2016-01-28 NOTE — Progress Notes (Signed)
No allergies to eggs or soy No past problems with anesthesia BUT AUNT DIED UNDER ANESTHESIA AND SISTER HAD MUCH DIFFICULTY WAKING UP No home oxygen No diet meds  Declined emmi

## 2016-02-11 ENCOUNTER — Encounter: Payer: Self-pay | Admitting: Internal Medicine

## 2016-02-11 ENCOUNTER — Ambulatory Visit (AMBULATORY_SURGERY_CENTER): Payer: Medicare Other | Admitting: Internal Medicine

## 2016-02-11 VITALS — BP 123/49 | HR 59 | Temp 97.3°F | Resp 13 | Ht 62.0 in | Wt 162.0 lb

## 2016-02-11 DIAGNOSIS — Z86010 Personal history of colonic polyps: Secondary | ICD-10-CM

## 2016-02-11 DIAGNOSIS — D122 Benign neoplasm of ascending colon: Secondary | ICD-10-CM | POA: Diagnosis not present

## 2016-02-11 DIAGNOSIS — D123 Benign neoplasm of transverse colon: Secondary | ICD-10-CM | POA: Diagnosis not present

## 2016-02-11 DIAGNOSIS — D120 Benign neoplasm of cecum: Secondary | ICD-10-CM

## 2016-02-11 MED ORDER — SODIUM CHLORIDE 0.9 % IV SOLN: 500.0000 mL | INTRAVENOUS | Status: DC

## 2016-02-11 NOTE — Patient Instructions (Signed)
YOU HAD AN ENDOSCOPIC PROCEDURE TODAY AT THE [facility]:   Refer to the procedure report that was given to you for any specific questions about what was found during the examination.  If the procedure report does not answer your questions, please call your gastroenterologist to clarify.  If you requested that your care partner not be given the details of your procedure findings, then the procedure report has been included in a sealed envelope for you to review at your convenience later.  YOU SHOULD EXPECT: Some feelings of bloating in the abdomen. Passage of more gas than usual.  Walking can help get rid of the air that was put into your GI tract during the procedure and reduce the bloating. If you had a lower endoscopy (such as a colonoscopy or flexible sigmoidoscopy) you may notice spotting of blood in your stool or on the toilet paper. If you underwent a bowel prep for your procedure, you may not have a normal bowel movement for a few days.  Please Note:  You might notice some irritation and congestion in your nose or some drainage.  This is from the oxygen used during your procedure.  There is no need for concern and it should clear up in a day or so.  SYMPTOMS TO REPORT IMMEDIATELY:   Following lower endoscopy (colonoscopy or flexible sigmoidoscopy):  Excessive amounts of blood in the stool  Significant tenderness or worsening of abdominal pains  Swelling of the abdomen that is new, acute  Fever of 100F or higher   For urgent or emergent issues, a gastroenterologist can be reached at any hour by calling [phone].   DIET:  We do recommend a small meal at first, but then you may proceed to your regular diet.  Drink plenty of fluids but you should avoid alcoholic beverages for 24 hours.  ACTIVITY:  You should plan to take it easy for the rest of today and you should NOT DRIVE or use heavy machinery until tomorrow (because of the sedation medicines used during the test).     FOLLOW UP: Our staff will call the number listed on your records the next business day following your procedure to check on you and address any questions or concerns that you may have regarding the information given to you following your procedure. If we do not reach you, we will leave a message.  However, if you are feeling well and you are not experiencing any problems, there is no need to return our call.  We will assume that you have returned to your regular daily activities without incident.  If any biopsies were taken you will be contacted by phone or by letter within the next 1-3 weeks.  Please call us at [phone] if you have not heard about the biopsies in 3 weeks.    SIGNATURES/CONFIDENTIALITY: You and/or your care partner have signed paperwork which will be entered into your electronic medical record.  These signatures attest to the fact that that the information above on your After Visit Summary has been reviewed and is understood.  Full responsibility of the confidentiality of this discharge information lies with you and/or your care-partner.    Information on polyps and diverticulosis given to you today 

## 2016-02-11 NOTE — Progress Notes (Signed)
Report to PACU, RN, vss, BBS= Clear.  

## 2016-02-11 NOTE — Progress Notes (Signed)
Called to room to assist during endoscopic procedure.  Patient ID and intended procedure confirmed with present staff. Received instructions for my participation in the procedure from the performing physician.  

## 2016-02-11 NOTE — Op Note (Signed)
[facility] Patient Name: Cynthia Werner Procedure Date: 02/11/2016 9:56 AM MRN: [id number] Endoscopist: Docia Chuck. Henrene Pastor , MD Age: 70 Referring MD:  Date of Birth: [birth year] Gender: Female Account #: [id number] Procedure:                Colonoscopy, with cold snare polypectomy X9 Indications:              Surveillance: Personal history of adenomatous                            polyps on last colonoscopy > 5 years ago. Index                            exam 2007. Adenoma. Overdue for follow-up Medicines:                Monitored Anesthesia Care Procedure:                Pre-Anesthesia Assessment:                           - Prior to the procedure, a History and Physical                            was performed, and patient medications and                            allergies were reviewed. The patient's tolerance of                            previous anesthesia was also reviewed. The risks                            and benefits of the procedure and the sedation                            options and risks were discussed with the patient.                            All questions were answered, and informed consent                            was obtained. Prior Anticoagulants: The patient has                            taken no previous anticoagulant or antiplatelet                            agents. ASA Grade Assessment: II - A patient with                            mild systemic disease. After reviewing the risks                            and benefits, the patient was deemed in  satisfactory condition to undergo the procedure.                           After obtaining informed consent, the colonoscope                            was passed under direct vision. Throughout the                            procedure, the patient's blood pressure, pulse, and                            oxygen saturations were monitored continuously. The                             Model CF-HQ190L [id number]) scope was introduced                            through the anus and advanced to the the cecum,                            identified by appendiceal orifice and ileocecal                            valve. The ileocecal valve, appendiceal orifice,                            and rectum were photographed. The quality of the                            bowel preparation was excellent. The colonoscopy                            was performed without difficulty. The patient                            tolerated the procedure well. The bowel preparation                            used was SUPREP. Scope In: 10:11:36 AM Scope Out: 10:32:57 AM Scope Withdrawal Time: 0 hours 18 minutes 32 seconds  Total Procedure Duration: 0 hours 21 minutes 21 seconds  Findings:                 Nine polyps were found in the transverse colon,                            ascending colon and cecum. The polyps were 2 to 6                            mm in size. These polyps were removed with a cold                            snare. Resection and retrieval were complete.  Multiple small-mouthed diverticula were found in                            the sigmoid colon.                           The exam was otherwise without abnormality on                            direct and retroflexion views. Complications:            No immediate complications. Estimated blood loss:                            None. Estimated Blood Loss:     Estimated blood loss: none. Impression:               - Nine 2 to 6 mm polyps in the transverse colon, in                            the ascending colon and in the cecum, removed with                            a cold snare. Resected and retrieved.                           - Diverticulosis in the sigmoid colon.                           - The examination was otherwise normal on direct                            and retroflexion  views. Recommendation:           - Repeat colonoscopy in 3 years for surveillance.                           - Patient has a contact number available for                            emergencies. The signs and symptoms of potential                            delayed complications were discussed with the                            patient. Return to normal activities tomorrow.                            Written discharge instructions were provided to the                            patient.                           - Resume previous diet.                           -  Continue present medications.                           - Await pathology results. Docia Chuck. Henrene Pastor, MD 02/11/2016 10:40:03 AM This report has been signed electronically.

## 2016-02-12 ENCOUNTER — Telehealth: Payer: Self-pay | Admitting: *Deleted

## 2016-02-12 NOTE — Telephone Encounter (Signed)
  Follow up Call-  Call back number 02/11/2016  Post procedure Call Back phone  # [phone]  Permission to leave phone message Yes  Some recent data might be hidden     Patient questions:  Do you have a fever, pain , or abdominal swelling? No. Pain Score  0 *  Have you tolerated food without any problems? Yes.    Have you been able to return to your normal activities? Yes.    Do you have any questions about your discharge instructions: Diet   No. Medications  No. Follow up visit  No.  Do you have questions or concerns about your Care? No.  Actions: * If pain score is 4 or above: No action needed, pain <4.  Pt. Expressed her appreciation for care she  Received at clinic yesterday.

## 2016-02-17 ENCOUNTER — Encounter: Payer: Self-pay | Admitting: Internal Medicine

## 2016-09-01 ENCOUNTER — Telehealth (INDEPENDENT_AMBULATORY_CARE_PROVIDER_SITE_OTHER): Payer: Self-pay | Admitting: Physical Medicine and Rehabilitation

## 2016-09-02 NOTE — Telephone Encounter (Signed)
Scheduled for 09/14/16 at 1500.

## 2016-09-02 NOTE — Telephone Encounter (Signed)
ok 

## 2016-09-14 ENCOUNTER — Encounter (INDEPENDENT_AMBULATORY_CARE_PROVIDER_SITE_OTHER): Payer: Self-pay | Admitting: Physical Medicine and Rehabilitation

## 2016-09-14 ENCOUNTER — Encounter (INDEPENDENT_AMBULATORY_CARE_PROVIDER_SITE_OTHER): Payer: Self-pay

## 2016-09-14 ENCOUNTER — Ambulatory Visit (INDEPENDENT_AMBULATORY_CARE_PROVIDER_SITE_OTHER): Payer: Self-pay

## 2016-09-14 ENCOUNTER — Ambulatory Visit (INDEPENDENT_AMBULATORY_CARE_PROVIDER_SITE_OTHER): Payer: Medicare Other | Admitting: Physical Medicine and Rehabilitation

## 2016-09-14 VITALS — BP 142/69 | HR 62

## 2016-09-14 DIAGNOSIS — M5416 Radiculopathy, lumbar region: Secondary | ICD-10-CM

## 2016-09-14 MED ORDER — METHYLPREDNISOLONE ACETATE 80 MG/ML IJ SUSP
80.0000 mg | Freq: Once | INTRAMUSCULAR | Status: AC
  Administered 2016-09-14: 80 mg

## 2016-09-14 MED ORDER — LIDOCAINE HCL (PF) 1 % IJ SOLN
0.3300 mL | Freq: Once | INTRAMUSCULAR | Status: AC
  Administered 2016-09-14: 0.3 mL

## 2016-09-14 NOTE — Progress Notes (Signed)
Cynthia Werner - 71 y.o. female MRN [id number]  Date of birth: Aug 07, 1945  Office Visit Note: Visit Date: 09/14/2016 PCP: [facility] Referred by: Brantley Fling Me*  Subjective: Chief Complaint  Patient presents with  . Lower Back - Pain   HPI: Cynthia Werner is a 71 year old female who is well-known to our office. We treated her while for her chronic low back and radicular complaints in that she saw a neurosurgeon but has not had any spine surgery. I last saw her in August of last year and completed right L5 transforaminal injection with good relief of her symptoms up until just recently. Increased pain for 2 weeks. Says she was pain free since last injection until 2 weeks ago.Right hip down back of the back of leg to foot. Gets worse the longer she stands. Feels burning in leg with sitting. Some tingling in foot.    ROS Otherwise per HPI.  Assessment & Plan: Visit Diagnoses:  1. Lumbar radiculopathy     Plan: Findings:  Right L5 transforaminal epidural steroid injection.    Meds & Orders:  Meds ordered this encounter  Medications  . lidocaine (PF) (XYLOCAINE) 1 % injection 0.3 mL  . methylPREDNISolone acetate (DEPO-MEDROL) injection 80 mg    Orders Placed This Encounter  Procedures  . XR C-ARM NO REPORT  . Epidural Steroid injection    Follow-up: Return if symptoms worsen or fail to improve.   Procedures: No procedures performed  Lumbosacral Transforaminal Epidural Steroid Injection - Infraneural Approach with Fluoroscopic Guidance  Patient: Cynthia Werner      Date of Birth: 1945-08-08 MRN: [id number] PCP: [facility]      Visit Date: 09/14/2016   Universal Protocol:    Date/Time: 09/16/1810:38 PM  Consent Given By: the patient  Position: PRONE   Additional Comments: Vital signs were monitored before and after the procedure. Patient was prepped and draped in the usual sterile fashion. The correct patient, procedure,  and site was verified.   Injection Procedure Details:  Procedure Site One Meds Administered:  Meds ordered this encounter  Medications  . lidocaine (PF) (XYLOCAINE) 1 % injection 0.3 mL  . methylPREDNISolone acetate (DEPO-MEDROL) injection 80 mg      Laterality: Right  Location/Site:  L5-S1  Needle size: 22 G  Needle type: Spinal  Needle Placement: Transforaminal  Findings:  -Contrast Used: 1 mL iohexol 180 mg iodine/mL   -Comments: Excellent flow of contrast along the nerve and into the epidural space.  Procedure Details: After squaring off the end-plates of the desired vertebral level to get a true AP view, the C-arm was obliqued to the painful side so that the superior articulating process is positioned about 1/3 the length of the inferior endplate.  The needle was aimed toward the junction of the superior articular process and the transverse process of the inferior vertebrae. The needle's initial entry is in the lower third of the foramen through Kambin's triangle. The soft tissues overlying this target were infiltrated with 2-3 ml. of 1% Lidocaine without Epinephrine.  The spinal needle was then inserted and advanced toward the target using a "trajectory" view along the fluoroscope beam.  Under AP and lateral visualization, the needle was advanced so it did not puncture dura and did not traverse medially beyond the 6 o'clock position of the pedicle. Bi-planar projections were used to confirm position. Aspiration was confirmed to be negative for CSF and/or blood. A 1-2 ml. volume of Isovue-250 was injected and flow  of contrast was noted at each level. Radiographs were obtained for documentation purposes.   After attaining the desired flow of contrast documented above, a 0.5 to 1.0 ml test dose of 0.25% Marcaine was injected into each respective transforaminal space.  The patient was observed for 90 seconds post injection.  After no sensory deficits were reported, and normal lower  extremity motor function was noted,   the above injectate was administered so that equal amounts of the injectate were placed at each foramen (level) into the transforaminal epidural space.   Additional Comments:  The patient tolerated the procedure well Dressing: Band-Aid    Post-procedure details: Patient was observed during the procedure. Post-procedure instructions were reviewed.  Patient left the clinic in stable condition.   Clinical History: Lumbar spine MRI 10/09/2013 IMPRESSION: 1. Lumbar spondylosis, scoliosis, and degenerative disc disease, causing moderate impingement at L3-4 and mild impingement at L4-5 and L5-S1, as detailed above. 2. The uterus demonstrates fibroids, and I suspect that the endometrium may be mildly thickened in the fundal region given the patient's age and presumed postmenopausal status. Accordingly, pelvic sonography is recommended to exclude the possibility of endometrial carcinoma.  She reports that she has never smoked. She has never used smokeless tobacco. No results for input(s): HGBA1C, LABURIC in the last 8760 hours.  Objective:  VS:  HT:    WT:   BMI:     BP:(!) 142/69  HR:62bpm  TEMP: ( )  RESP:100 % Physical Exam  Musculoskeletal:  The patient ambulates without aid with a forward flexed spine with good distal strength.    Ortho Exam Imaging: Xr C-arm No Report  Result Date: 09/14/2016 Please see Notes or Procedures tab for imaging impression.   Past Medical/Family/Surgical/Social History: Medications & Allergies reviewed per EMR Patient Active Problem List   Diagnosis Date Noted  . HNP (herniated nucleus pulposus), lumbar 06/29/2011    Class: Diagnosis of  . PERSONAL HX COLONIC POLYPS 02/18/2009  . OBESITY 12/31/2008  . GERD 12/31/2008  . IBS 12/31/2008   Past Medical History:  Diagnosis Date  . Arthritis    low back, HNP- L5-S1  . GERD (gastroesophageal reflux disease)   . Hypertension    stress test done 10+  yrs. ago,  done due to being on a diet pill, told that it was  wnl   . Hypothyroidism   . Shortness of breath    Family History  Problem Relation Age of Onset  . Anesthesia problems Sister     father's sister didn't wake from anesthesia , pt.'s sister woke up slowly  . Hypotension Neg Hx   . Malignant hyperthermia Neg Hx   . Pseudochol deficiency Neg Hx   . Colon cancer Neg Hx    Past Surgical History:  Procedure Laterality Date  . CHOLECYSTECTOMY     2003  . FOOT NEUROMA SURGERY     2005- R foot, "nerve snipped "  . FRACTURE SURGERY     L foot, bunionectomy followed by special surgery to further stabilize the foot   . LUMBAR LAMINECTOMY  06/29/2011   x2;2nd sx at [facility] Procedure: MICRODISCECTOMY LUMBAR LAMINECTOMY;  Surgeon: Marybelle Killings, MD;  Location: [facility];  Service: Orthopedics;  Laterality: Right;  Right L5-S1 Microdiscectomy  . TUBAL LIGATION     Social History   Occupational History  . Not on file.   Social History Main Topics  . Smoking status: Never Smoker  . Smokeless tobacco: Never Used  . Alcohol use No  .  Drug use: No  . Sexual activity: Not on file

## 2016-09-14 NOTE — Patient Instructions (Signed)

## 2016-09-15 NOTE — Procedures (Signed)
Lumbosacral Transforaminal Epidural Steroid Injection - Infraneural Approach with Fluoroscopic Guidance  Patient: Cynthia Werner      Date of Birth: [birth year] MRN: [id number] PCP: [facility]      Visit Date: 09/14/2016   Universal Protocol:    Date/Time: 09/16/1810:38 PM  Consent Given By: the patient  Position: PRONE   Additional Comments: Vital signs were monitored before and after the procedure. Patient was prepped and draped in the usual sterile fashion. The correct patient, procedure, and site was verified.   Injection Procedure Details:  Procedure Site One Meds Administered:  Meds ordered this encounter  Medications  . lidocaine (PF) (XYLOCAINE) 1 % injection 0.3 mL  . methylPREDNISolone acetate (DEPO-MEDROL) injection 80 mg      Laterality: Right  Location/Site:  L5-S1  Needle size: 22 G  Needle type: Spinal  Needle Placement: Transforaminal  Findings:  -Contrast Used: 1 mL iohexol 180 mg iodine/mL   -Comments: Excellent flow of contrast along the nerve and into the epidural space.  Procedure Details: After squaring off the end-plates of the desired vertebral level to get a true AP view, the C-arm was obliqued to the painful side so that the superior articulating process is positioned about 1/3 the length of the inferior endplate.  The needle was aimed toward the junction of the superior articular process and the transverse process of the inferior vertebrae. The needle's initial entry is in the lower third of the foramen through Kambin's triangle. The soft tissues overlying this target were infiltrated with 2-3 ml. of 1% Lidocaine without Epinephrine.  The spinal needle was then inserted and advanced toward the target using a "trajectory" view along the fluoroscope beam.  Under AP and lateral visualization, the needle was advanced so it did not puncture dura and did not traverse medially beyond the 6 o'clock position of the pedicle. Bi-planar  projections were used to confirm position. Aspiration was confirmed to be negative for CSF and/or blood. A 1-2 ml. volume of Isovue-250 was injected and flow of contrast was noted at each level. Radiographs were obtained for documentation purposes.   After attaining the desired flow of contrast documented above, a 0.5 to 1.0 ml test dose of 0.25% Marcaine was injected into each respective transforaminal space.  The patient was observed for 90 seconds post injection.  After no sensory deficits were reported, and normal lower extremity motor function was noted,   the above injectate was administered so that equal amounts of the injectate were placed at each foramen (level) into the transforaminal epidural space.   Additional Comments:  The patient tolerated the procedure well Dressing: Band-Aid    Post-procedure details: Patient was observed during the procedure. Post-procedure instructions were reviewed.  Patient left the clinic in stable condition.

## 2017-08-31 ENCOUNTER — Telehealth (INDEPENDENT_AMBULATORY_CARE_PROVIDER_SITE_OTHER): Payer: Self-pay | Admitting: Orthopaedic Surgery

## 2017-08-31 NOTE — Telephone Encounter (Signed)
Patient called saying that her arms are really sore since her back injection with Dr. Ernestina Patches. She wanted to know if she should make an appointment and just get your opinion on some things. CB # [phone]

## 2017-08-31 NOTE — Telephone Encounter (Signed)
Patient last saw Dr. Lorin Mercy in 2015.  Her last injection with Dr. Ernestina Patches was 09/14/2016 (almost a year ago).  She will have to make appointment with Dr. Lorin Mercy if she would like to speak with him about ongoing issues.  If it is an appointment for her back, please make it a 30 minute appt as she has not been seen recently. Thanks.

## 2017-11-21 ENCOUNTER — Ambulatory Visit: Payer: Medicare Other | Admitting: Sports Medicine

## 2017-11-21 ENCOUNTER — Encounter: Payer: Self-pay | Admitting: Sports Medicine

## 2017-11-21 VITALS — BP 143/72 | HR 63

## 2017-11-21 DIAGNOSIS — L84 Corns and callosities: Secondary | ICD-10-CM | POA: Diagnosis not present

## 2017-11-21 DIAGNOSIS — L603 Nail dystrophy: Secondary | ICD-10-CM

## 2017-11-21 DIAGNOSIS — M79675 Pain in left toe(s): Secondary | ICD-10-CM

## 2017-11-21 NOTE — Patient Instructions (Signed)

## 2017-11-21 NOTE — Progress Notes (Signed)
Subjective: Cynthia Werner is a 72 y.o. female patient who presents to office for evaluation of Left foot pain secondary to callus skin. Patient complains of pain at the lesion present Left foot at the 1st toe. Patient has tried self trimming and creams with no relief in symptoms. Patient also reports that her left 1st toenail is lifting and wants to have it checked. Patient denies any other pedal complaints.  Review of Systems  All other systems reviewed and are negative.   Patient Active Problem List   Diagnosis Date Noted  . HNP (herniated nucleus pulposus), lumbar 06/29/2011    Class: Diagnosis of  . PERSONAL HX COLONIC POLYPS 02/18/2009  . OBESITY 12/31/2008  . GERD 12/31/2008  . IBS 12/31/2008    Current Outpatient Medications on File Prior to Visit  Medication Sig Dispense Refill  . amLODipine-benazepril (LOTREL) 5-10 MG capsule amlodipine 5 mg-benazepril 10 mg capsule  TAKE ONE CAPSULE BY MOUTH EVERY DAY FOR HYPERTENSION    . aspirin 81 MG chewable tablet aspirin 81 mg chewable tablet  Chew 1 tablet every day by oral route.    Marland Kitchen atorvastatin (LIPITOR) 40 MG tablet atorvastatin 40 mg tablet  TAKE 1 TABLET BY MOUTH DAILY FOR CHOLESTEROL    . levothyroxine (SYNTHROID, LEVOTHROID) 50 MCG tablet levothyroxine 50 mcg tablet  TAKE 1 TABLET BY MOUTH DAILY    . pregabalin (LYRICA) 75 MG capsule Lyrica 75 mg capsule  TAKE 1 CAPSULE BY MOUTH 1 - 2 TIMES DAILY FOR CHRONIC BACK PAIN/RADICULAR NERVE PAIN     Current Facility-Administered Medications on File Prior to Visit  Medication Dose Route Frequency Provider Last Rate Last Dose  . 0.9 %  sodium chloride infusion  500 mL Intravenous Continuous Irene Shipper, MD        Allergies  Allergen Reactions  . Other     Objective:  General: Alert and oriented x3 in no acute distress  Dermatology: Keratotic lesion present medial 1st toe with skin lines transversing the lesion, pain is present with direct pressure to the lesion with a  central nucleated core noted, no webspace macerations, no ecchymosis bilateral, all nails x 10 are well manicured but 1st toe on left is lifting distally with dry blood proximally. Minimal tenderness to left 1st toenail.  Vascular: Dorsalis Pedis and Posterior Tibial pedal pulses 1/4, Capillary Fill Time 5 seconds, + varicosities, scant pedal hair growth bilateral, no edema bilateral lower extremities, Temperature gradient within normal limits.  Neurology: Johney Maine sensation intact via light touch bilateral.  Musculoskeletal: Mild tenderness with palpation at the keratotic lesion site on Left and minimal pain to left 1st toenail, + hallux valgus deformity L>R, Muscular strength 5/5 in all groups without pain or limitation on range of motion.  Assessment and Plan: Problem List Items Addressed This Visit    None    Visit Diagnoses    Nail dystrophy    -  Primary   Skin callus       Toe pain, left         -Complete examination performed -Discussed treatment options for callus and nail -Patient refused nail trim and reports that she will get her pedicurist to put a fake nail or glue it -At no charged, Parred keratoic lesion using a chisel blade -Encouraged daily skin emollients -Encouraged use of pumice stone -Advised good supportive shoes and inserts -Dispensed toe cap for left 1st toe  -Patient to return to office as needed or sooner if condition worsens.  Kindall Swaby  Cannon Kettle, DPM

## 2018-01-17 ENCOUNTER — Encounter (INDEPENDENT_AMBULATORY_CARE_PROVIDER_SITE_OTHER): Payer: Self-pay | Admitting: Physical Medicine and Rehabilitation

## 2018-01-17 ENCOUNTER — Telehealth (INDEPENDENT_AMBULATORY_CARE_PROVIDER_SITE_OTHER): Payer: Self-pay | Admitting: *Deleted

## 2018-01-17 ENCOUNTER — Ambulatory Visit (INDEPENDENT_AMBULATORY_CARE_PROVIDER_SITE_OTHER): Payer: Medicare Other | Admitting: Physical Medicine and Rehabilitation

## 2018-01-17 VITALS — BP 132/62 | HR 56 | Temp 98.3°F | Ht 63.0 in | Wt 155.0 lb

## 2018-01-17 DIAGNOSIS — M5416 Radiculopathy, lumbar region: Secondary | ICD-10-CM

## 2018-01-17 DIAGNOSIS — G894 Chronic pain syndrome: Secondary | ICD-10-CM

## 2018-01-17 DIAGNOSIS — M961 Postlaminectomy syndrome, not elsewhere classified: Secondary | ICD-10-CM

## 2018-01-17 NOTE — Progress Notes (Signed)
 .  Numeric Pain Rating Scale and Functional Assessment Average Pain 4 Pain Right Now 3 My pain is constant, sharp, burning and stabbing Pain is worse with: walking and standing Pain improves with: rest   In the last MONTH (on 0-10 scale) has pain interfered with the following?  1. General activity like being  able to carry out your everyday physical activities such as walking, climbing stairs, carrying groceries, or moving a chair?  Rating(5)  2. Relation with others like being able to carry out your usual social activities and roles such as  activities at home, at work and in your community. Rating(4)  3. Enjoyment of life such that you have  been bothered by emotional problems such as feeling anxious, depressed or irritable?  Rating(0)

## 2018-01-17 NOTE — Telephone Encounter (Signed)
Procedure code [phone] Notification or Prior Authorization is not required for the requested services.

## 2018-01-18 ENCOUNTER — Encounter (INDEPENDENT_AMBULATORY_CARE_PROVIDER_SITE_OTHER): Payer: Self-pay | Admitting: Physical Medicine and Rehabilitation

## 2018-01-18 DIAGNOSIS — M961 Postlaminectomy syndrome, not elsewhere classified: Secondary | ICD-10-CM | POA: Insufficient documentation

## 2018-01-18 DIAGNOSIS — G894 Chronic pain syndrome: Secondary | ICD-10-CM | POA: Insufficient documentation

## 2018-01-18 NOTE — Progress Notes (Signed)
Cynthia Werner - 72 y.o. female MRN [id number]  Date of birth: Oct 25, 1945  Office Visit Note: Visit Date: 01/17/2018 PCP: Cyndi Bender, PA-C Referred by: Cyndi Bender, PA-C  Subjective: Chief Complaint  Patient presents with  . Lower Back - Pain  . Right Leg - Pain   HPI: Cynthia Werner is a 72 year old female that I have seen off and on for many years.  She has a chronic history of back pain and radicular pain from essentially adult scoliosis and degenerative facet arthropathy and stenosis.  She underwent L4-5 and L5-S1 laminectomy and decompression in 2016 by Dr. Gloris Manchester at [facility].  She continued to have back and right hip and leg pain.  MRI performed in 2017 continues to so some foraminal stenosis on the right at L5-S1.  I last saw her in April 2018 and completed right L5 transforaminal epidural steroid injection with decent relief up until just recently.  She reports of burning pain in the lower back that radiates into the right leg with some numbness into the foot and a pretty classic L5 distribution.  She reports about 1 month of worsening pain without any specific injury.  Worse with standing and walking.  Resting in sitting makes the pain better.  She rates her average pain is a 4 out of 10 but it does limit what she can do and really is more limiting for her than anything else.  She reports this is a constant sharp burning and stabbing pain.  She has difficulty with getting in and out of chairs in her car.  She has not noted any focal weakness.  She has not had any left-sided complaints down the legs.  She has had no fevers chills or night sweats or other red flag complaints.  No bowel or bladder issues.  She continues to use Lyrica 75 mg twice a day.   Review of Systems  Constitutional: Negative for chills, fever, malaise/fatigue and weight loss.  HENT: Negative for hearing loss and sinus pain.   Eyes: Negative for blurred vision, double vision and photophobia.  Respiratory:  Negative for cough and shortness of breath.   Cardiovascular: Negative for chest pain, palpitations and leg swelling.  Gastrointestinal: Negative for abdominal pain, nausea and vomiting.  Genitourinary: Negative for flank pain.  Musculoskeletal: Positive for back pain. Negative for myalgias.       Right radicular leg pain  Skin: Negative for itching and rash.  Neurological: Positive for tingling. Negative for tremors, focal weakness and weakness.  Endo/Heme/Allergies: Negative.   Psychiatric/Behavioral: Negative for depression.  All other systems reviewed and are negative.  Otherwise per HPI.  Assessment & Plan: Visit Diagnoses:  1. Lumbar radiculopathy   2. Post laminectomy syndrome   3. Chronic pain syndrome     Plan: Findings:  Chronic history of low back and right radicular leg pain with history of adult scoliosis and stenosis status post lumbar laminectomy at L4-5 and L5-S1 with continued radicular complaints intermittently worsened and exacerbated.  She reports 6 weeks of worsening symptoms without resolution.  She continues take Lyrica twice a day.  Prior injection performed last year in April did help her quite a bit.  We are going to repeat the right L5 transforaminal epidural steroid injection.  We talked about spinal cord stimulator trials once again.  Patient has had prior neuropsychology testing by Dr. Thayer Ohm I believe.  She opted for surgical opinion at the time we were discussing spinal cord stimulator trials in the past.  I still think she be a good candidate for this.    Meds & Orders: No orders of the defined types were placed in this encounter.  No orders of the defined types were placed in this encounter.   Follow-up: Return for Right L5 transforaminal epidural steroid injection.   Procedures: No procedures performed  No notes on file   Clinical History: INDICATION: SCIATICA, AND/OR RADICULOPATHY, <6WKS, , Radiculopathy of lumbar region, M54.16 Radiculopathy,  lumbar region provided.   COMPARISON: MRI lumbar spine 10/09/2013  TECHNIQUE/PROTOCOL: HNP protocol lumbar spine pre and post contrast MRI performed.  CONTRAST: 35mL MultiHance IV. This MRI was performed before and after IV administration of contrast material. IV contrast was administered to improve disease detection and further define anatomy.  *GFR: Greater than 60 *Complications:No immediate patient complications or events noted.  FINDINGS: There is a dextrocurvature centered about the L3 vertebral body. There is severe degenerative disc disease at L3-L4 and L4-L5. Marrow is unremarkable. Conus terminates at approximately L1. The visualized spinal cord morphology and signal, and the cauda equina appear normal. Negative for epidural hematoma. Retroperitoneal soft tissues are unremarkable. Sacroiliac joints are normal.  --T12-L1: There is no evidence of spinal stenosis, disc bulge or neural foraminal narrowing. No facet degenerative disease.  --L1-L2: There is no evidence of spinal stenosis, disc bulge or neural foraminal narrowing. No facet degenerative disease. --L2-L3: There is no evidence of spinal stenosis, disc bulge or neural foraminal narrowing. There is mild left facet degenerative disease. --L3-L4: There is a mild circumferential disc bulge slightly eccentric to the left without canal stenosis.There is moderate left facet degenerative disease. There is moderate left neural foraminal and lateral recess narrowing. This is unchanged from prior. --L4-L5: There is a mild right paracentral disc bulge without canal stenosis.here is moderate facet degenerative disease.There is mild bilateral neural foraminal narrowing. This is unchanged from prior. --L5-S1: There are postsurgical changes of a right hemilaminectomy. There is a mild broad-based disc bulge and ligamentum flavum hypertrophy without central canal stenosis. There is moderate right neural foraminal  narrowing.  These findings are unchanged.  IMPRESSION: 1. Multilevel facet arthrosis resulting in moderate left L3-4 and right L5-S1 neuroforaminal stenosis, unchanged. 2. No significant spinal canal stenosis.  Electronically Reviewed by:Rolin Barry, MD Electronically Reviewed on:07/15/2015 4:28 PM Lumbar spine MRI 10/09/2013 IMPRESSION: 1. Lumbar spondylosis, scoliosis, and degenerative disc disease, causing moderate impingement at L3-4 and mild impingement at L4-5 and L5-S1, as detailed above. 2. The uterus demonstrates fibroids, and I suspect that the endometrium may be mildly thickened in the fundal region given the patient's age and presumed postmenopausal status. Accordingly, pelvic sonography is recommended to exclude the possibility of endometrial carcinoma.   She reports that she has never smoked. She has never used smokeless tobacco. No results for input(s): HGBA1C, LABURIC in the last 8760 hours.  Objective:  VS:  HT:5\' 3"  (160 cm)   WT:155 lb (70.3 kg)  BMI:27.46    BP:132/62  HR:(!) 56bpm  TEMP:98.3 F (36.8 C)(Oral)  RESP:99 % Physical Exam  Constitutional: She is oriented to person, place, and time. She appears well-developed and well-nourished.  Eyes: Pupils are equal, round, and reactive to light. Conjunctivae and EOM are normal.  Cardiovascular: Normal rate and intact distal pulses.  Pulmonary/Chest: Effort normal.  Musculoskeletal:  Examination lumbar spine shows mild scoliotic deformity with painful extension and facet joint loading.  Equivocally positive slump test on the right.  No pain over the greater trochanters good distal strength without clonus.  Neurological:  She is alert and oriented to person, place, and time. She exhibits normal muscle tone. Coordination normal.  Skin: Skin is warm and dry. No rash noted. No erythema.  Psychiatric: She has a normal mood and affect. Her behavior is normal.  Nursing note and vitals reviewed.   Ortho  Exam Imaging: No results found.  Past Medical/Family/Surgical/Social History: Medications & Allergies reviewed per EMR, new medications updated. Patient Active Problem List   Diagnosis Date Noted  . Post laminectomy syndrome 01/18/2018  . Chronic pain syndrome 01/18/2018  . HNP (herniated nucleus pulposus), lumbar 06/29/2011    Class: Diagnosis of  . PERSONAL HX COLONIC POLYPS 02/18/2009  . OBESITY 12/31/2008  . GERD 12/31/2008  . IBS 12/31/2008   Past Medical History:  Diagnosis Date  . Arthritis    low back, HNP- L5-S1  . GERD (gastroesophageal reflux disease)   . Hypertension    stress test done 10+ yrs. ago,  done due to being on a diet pill, told that it was  wnl   . Hypothyroidism   . Shortness of breath    Family History  Problem Relation Age of Onset  . Anesthesia problems Sister        father's sister didn't wake from anesthesia , pt.'s sister woke up slowly  . Hypotension Neg Hx   . Malignant hyperthermia Neg Hx   . Pseudochol deficiency Neg Hx   . Colon cancer Neg Hx    Past Surgical History:  Procedure Laterality Date  . CHOLECYSTECTOMY     2003  . FOOT NEUROMA SURGERY     2005- R foot, "nerve snipped "  . FRACTURE SURGERY     L foot, bunionectomy followed by special surgery to further stabilize the foot   . LUMBAR LAMINECTOMY  06/29/2011   x2;2nd sx at [facility] Procedure: MICRODISCECTOMY LUMBAR LAMINECTOMY;  Surgeon: Marybelle Killings, MD;  Location: [place];  Service: Orthopedics;  Laterality: Right;  Right L5-S1 Microdiscectomy  . TUBAL LIGATION     Social History   Occupational History  . Not on file  Tobacco Use  . Smoking status: Never Smoker  . Smokeless tobacco: Never Used  Substance and Sexual Activity  . Alcohol use: No  . Drug use: No  . Sexual activity: Not on file

## 2018-01-30 ENCOUNTER — Ambulatory Visit (INDEPENDENT_AMBULATORY_CARE_PROVIDER_SITE_OTHER): Payer: Self-pay

## 2018-01-30 ENCOUNTER — Encounter (INDEPENDENT_AMBULATORY_CARE_PROVIDER_SITE_OTHER): Payer: Self-pay | Admitting: Physical Medicine and Rehabilitation

## 2018-01-30 ENCOUNTER — Ambulatory Visit (INDEPENDENT_AMBULATORY_CARE_PROVIDER_SITE_OTHER): Payer: Medicare Other | Admitting: Physical Medicine and Rehabilitation

## 2018-01-30 VITALS — BP 132/67 | HR 56 | Temp 98.7°F

## 2018-01-30 DIAGNOSIS — M5416 Radiculopathy, lumbar region: Secondary | ICD-10-CM | POA: Diagnosis not present

## 2018-01-30 MED ORDER — BETAMETHASONE SOD PHOS & ACET 6 (3-3) MG/ML IJ SUSP
12.0000 mg | Freq: Once | INTRAMUSCULAR | Status: AC
  Administered 2018-01-30: 12 mg

## 2018-01-30 NOTE — Progress Notes (Signed)
 .  Numeric Pain Rating Scale and Functional Assessment Average Pain 7   In the last MONTH (on 0-10 scale) has pain interfered with the following?  1. General activity like being  able to carry out your everyday physical activities such as walking, climbing stairs, carrying groceries, or moving a chair?  Rating(4)   +Driver, -BT, -Dye Allergies.  

## 2018-01-30 NOTE — Patient Instructions (Signed)

## 2018-02-15 NOTE — Procedures (Signed)
Lumbosacral Transforaminal Epidural Steroid Injection - Sub-Pedicular Approach with Fluoroscopic Guidance  Patient: Cynthia Werner      Date of Birth: [birth year] MRN: [id number] PCP: Cyndi Bender, PA-C      Visit Date: 01/30/2018   Universal Protocol:    Date/Time: 01/30/2018  Consent Given By: the patient  Position: PRONE  Additional Comments: Vital signs were monitored before and after the procedure. Patient was prepped and draped in the usual sterile fashion. The correct patient, procedure, and site was verified.   Injection Procedure Details:  Procedure Site One Meds Administered:  Meds ordered this encounter  Medications  . betamethasone acetate-betamethasone sodium phosphate (CELESTONE) injection 12 mg    Laterality: Right  Location/Site:  L5-S1  Needle size: 22 G  Needle type: Spinal  Needle Placement: Transforaminal  Findings:    -Comments: Excellent flow of contrast along the nerve and into the epidural space.  Procedure Details: After squaring off the end-plates to get a true AP view, the C-arm was positioned so that an oblique view of the foramen as noted above was visualized. The target area is just inferior to the "nose of the scotty dog" or sub pedicular. The soft tissues overlying this structure were infiltrated with 2-3 ml. of 1% Lidocaine without Epinephrine.  The spinal needle was inserted toward the target using a "trajectory" view along the fluoroscope beam.  Under AP and lateral visualization, the needle was advanced so it did not puncture dura and was located close the 6 O'Clock position of the pedical in AP tracterory. Biplanar projections were used to confirm position. Aspiration was confirmed to be negative for CSF and/or blood. A 1-2 ml. volume of Isovue-250 was injected and flow of contrast was noted at each level. Radiographs were obtained for documentation purposes.   After attaining the desired flow of contrast documented above, a 0.5 to  1.0 ml test dose of 0.25% Marcaine was injected into each respective transforaminal space.  The patient was observed for 90 seconds post injection.  After no sensory deficits were reported, and normal lower extremity motor function was noted,   the above injectate was administered so that equal amounts of the injectate were placed at each foramen (level) into the transforaminal epidural space.   Additional Comments:  The patient tolerated the procedure well Dressing: Band-Aid    Post-procedure details: Patient was observed during the procedure. Post-procedure instructions were reviewed.  Patient left the clinic in stable condition.

## 2018-02-15 NOTE — Progress Notes (Signed)
Cynthia Werner - 72 y.o. female MRN [id number]  Date of birth: 1946-02-12  Office Visit Note: Visit Date: 01/30/2018 PCP: Cyndi Bender, PA-C Referred by: Cyndi Bender, PA-C  Subjective: Chief Complaint  Patient presents with  . Lower Back - Pain  . Right Leg - Pain   HPI: Cynthia Werner is a very pleasant 72 year old female who comes in today for planned right L5 transforaminal steroid injection.  Please see our prior evaluation and management note for further details and justification.   ROS Otherwise per HPI.  Assessment & Plan: Visit Diagnoses:  1. Lumbar radiculopathy     Plan: No additional findings.   Meds & Orders:  Meds ordered this encounter  Medications  . betamethasone acetate-betamethasone sodium phosphate (CELESTONE) injection 12 mg    Orders Placed This Encounter  Procedures  . XR C-ARM NO REPORT  . Epidural Steroid injection    Follow-up: Return if symptoms worsen or fail to improve.   Procedures: No procedures performed  Lumbosacral Transforaminal Epidural Steroid Injection - Sub-Pedicular Approach with Fluoroscopic Guidance  Patient: Cynthia Werner      Date of Birth: 05-May-1946 MRN: [id number] PCP: Cyndi Bender, PA-C      Visit Date: 01/30/2018   Universal Protocol:    Date/Time: 01/30/2018  Consent Given By: the patient  Position: PRONE  Additional Comments: Vital signs were monitored before and after the procedure. Patient was prepped and draped in the usual sterile fashion. The correct patient, procedure, and site was verified.   Injection Procedure Details:  Procedure Site One Meds Administered:  Meds ordered this encounter  Medications  . betamethasone acetate-betamethasone sodium phosphate (CELESTONE) injection 12 mg    Laterality: Right  Location/Site:  L5-S1  Needle size: 22 G  Needle type: Spinal  Needle Placement: Transforaminal  Findings:    -Comments: Excellent flow of contrast along the nerve and into  the epidural space.  Procedure Details: After squaring off the end-plates to get a true AP view, the C-arm was positioned so that an oblique view of the foramen as noted above was visualized. The target area is just inferior to the "nose of the scotty dog" or sub pedicular. The soft tissues overlying this structure were infiltrated with 2-3 ml. of 1% Lidocaine without Epinephrine.  The spinal needle was inserted toward the target using a "trajectory" view along the fluoroscope beam.  Under AP and lateral visualization, the needle was advanced so it did not puncture dura and was located close the 6 O'Clock position of the pedical in AP tracterory. Biplanar projections were used to confirm position. Aspiration was confirmed to be negative for CSF and/or blood. A 1-2 ml. volume of Isovue-250 was injected and flow of contrast was noted at each level. Radiographs were obtained for documentation purposes.   After attaining the desired flow of contrast documented above, a 0.5 to 1.0 ml test dose of 0.25% Marcaine was injected into each respective transforaminal space.  The patient was observed for 90 seconds post injection.  After no sensory deficits were reported, and normal lower extremity motor function was noted,   the above injectate was administered so that equal amounts of the injectate were placed at each foramen (level) into the transforaminal epidural space.   Additional Comments:  The patient tolerated the procedure well Dressing: Band-Aid    Post-procedure details: Patient was observed during the procedure. Post-procedure instructions were reviewed.  Patient left the clinic in stable condition.     Clinical History: INDICATION: SCIATICA,  AND/OR RADICULOPATHY, <6WKS, , Radiculopathy of lumbar region, M54.16 Radiculopathy, lumbar region provided.   COMPARISON: MRI lumbar spine 10/09/2013  TECHNIQUE/PROTOCOL: HNP protocol lumbar spine pre and post contrast  MRI performed.  CONTRAST: 64mL MultiHance IV. This MRI was performed before and after IV administration of contrast material. IV contrast was administered to improve disease detection and further define anatomy.  *GFR: Greater than 60 *Complications:No immediate patient complications or events noted.  FINDINGS: There is a dextrocurvature centered about the L3 vertebral body. There is severe degenerative disc disease at L3-L4 and L4-L5. Marrow is unremarkable. Conus terminates at approximately L1. The visualized spinal cord morphology and signal, and the cauda equina appear normal. Negative for epidural hematoma. Retroperitoneal soft tissues are unremarkable. Sacroiliac joints are normal.  --T12-L1: There is no evidence of spinal stenosis, disc bulge or neural foraminal narrowing. No facet degenerative disease.  --L1-L2: There is no evidence of spinal stenosis, disc bulge or neural foraminal narrowing. No facet degenerative disease. --L2-L3: There is no evidence of spinal stenosis, disc bulge or neural foraminal narrowing. There is mild left facet degenerative disease. --L3-L4: There is a mild circumferential disc bulge slightly eccentric to the left without canal stenosis.There is moderate left facet degenerative disease. There is moderate left neural foraminal and lateral recess narrowing. This is unchanged from prior. --L4-L5: There is a mild right paracentral disc bulge without canal stenosis.here is moderate facet degenerative disease.There is mild bilateral neural foraminal narrowing. This is unchanged from prior. --L5-S1: There are postsurgical changes of a right hemilaminectomy. There is a mild broad-based disc bulge and ligamentum flavum hypertrophy without central canal stenosis. There is moderate right neural foraminal narrowing.  These findings are unchanged.  IMPRESSION: 1. Multilevel facet arthrosis resulting in moderate left L3-4 and right L5-S1  neuroforaminal stenosis, unchanged. 2. No significant spinal canal stenosis.  Electronically Reviewed by:Rolin Barry, MD Electronically Reviewed on:07/15/2015 4:28 PM Lumbar spine MRI 10/09/2013 IMPRESSION: 1. Lumbar spondylosis, scoliosis, and degenerative disc disease, causing moderate impingement at L3-4 and mild impingement at L4-5 and L5-S1, as detailed above. 2. The uterus demonstrates fibroids, and I suspect that the endometrium may be mildly thickened in the fundal region given the patient's age and presumed postmenopausal status. Accordingly, pelvic sonography is recommended to exclude the possibility of endometrial carcinoma.   She reports that she has never smoked. She has never used smokeless tobacco. No results for input(s): HGBA1C, LABURIC in the last 8760 hours.  Objective:  VS:  HT:    WT:   BMI:     BP:132/67  HR:(!) 56bpm  TEMP:98.7 F (37.1 C)(Oral)  RESP:  Physical Exam  Ortho Exam Imaging: No results found.  Past Medical/Family/Surgical/Social History: Medications & Allergies reviewed per EMR, new medications updated. Patient Active Problem List   Diagnosis Date Noted  . Post laminectomy syndrome 01/18/2018  . Chronic pain syndrome 01/18/2018  . HNP (herniated nucleus pulposus), lumbar 06/29/2011    Class: Diagnosis of  . PERSONAL HX COLONIC POLYPS 02/18/2009  . OBESITY 12/31/2008  . GERD 12/31/2008  . IBS 12/31/2008   Past Medical History:  Diagnosis Date  . Arthritis    low back, HNP- L5-S1  . GERD (gastroesophageal reflux disease)   . Hypertension    stress test done 10+ yrs. ago,  done due to being on a diet pill, told that it was  wnl   . Hypothyroidism   . Shortness of breath    Family History  Problem Relation Age of Onset  . Anesthesia problems  Sister        father's sister didn't wake from anesthesia , pt.'s sister woke up slowly  . Hypotension Neg Hx   . Malignant hyperthermia Neg Hx   . Pseudochol deficiency Neg Hx   .  Colon cancer Neg Hx    Past Surgical History:  Procedure Laterality Date  . CHOLECYSTECTOMY     2003  . FOOT NEUROMA SURGERY     2005- R foot, "nerve snipped "  . FRACTURE SURGERY     L foot, bunionectomy followed by special surgery to further stabilize the foot   . LUMBAR LAMINECTOMY  06/29/2011   x2;2nd sx at [facility] Procedure: MICRODISCECTOMY LUMBAR LAMINECTOMY;  Surgeon: Marybelle Killings, MD;  Location: [place];  Service: Orthopedics;  Laterality: Right;  Right L5-S1 Microdiscectomy  . TUBAL LIGATION     Social History   Occupational History  . Not on file  Tobacco Use  . Smoking status: Never Smoker  . Smokeless tobacco: Never Used  Substance and Sexual Activity  . Alcohol use: No  . Drug use: No  . Sexual activity: Not on file

## 2018-02-16 ENCOUNTER — Other Ambulatory Visit: Payer: Self-pay | Admitting: Obstetrics & Gynecology

## 2018-02-16 DIAGNOSIS — R921 Mammographic calcification found on diagnostic imaging of breast: Secondary | ICD-10-CM

## 2018-02-23 ENCOUNTER — Ambulatory Visit
Admission: RE | Admit: 2018-02-23 | Discharge: 2018-02-23 | Disposition: A | Discharge status: Home / Self Care | Payer: Medicare Other | Source: Ambulatory Visit | Attending: Obstetrics & Gynecology | Admitting: Obstetrics & Gynecology

## 2018-02-23 DIAGNOSIS — R921 Mammographic calcification found on diagnostic imaging of breast: Secondary | ICD-10-CM

## 2019-02-04 ENCOUNTER — Encounter: Payer: Self-pay | Admitting: Internal Medicine

## 2019-09-23 ENCOUNTER — Other Ambulatory Visit: Payer: Self-pay | Admitting: Physician Assistant

## 2019-09-23 DIAGNOSIS — Z1231 Encounter for screening mammogram for malignant neoplasm of breast: Secondary | ICD-10-CM

## 2019-09-23 DIAGNOSIS — R5381 Other malaise: Secondary | ICD-10-CM

## 2019-09-30 ENCOUNTER — Other Ambulatory Visit: Payer: Self-pay

## 2019-09-30 ENCOUNTER — Ambulatory Visit: Payer: Self-pay

## 2019-09-30 ENCOUNTER — Encounter: Payer: Self-pay | Admitting: Orthopaedic Surgery

## 2019-09-30 ENCOUNTER — Ambulatory Visit: Payer: Medicare PPO | Admitting: Orthopaedic Surgery

## 2019-09-30 VITALS — Ht 63.0 in | Wt 180.0 lb

## 2019-09-30 DIAGNOSIS — M542 Cervicalgia: Secondary | ICD-10-CM | POA: Diagnosis not present

## 2019-09-30 DIAGNOSIS — M79642 Pain in left hand: Secondary | ICD-10-CM | POA: Diagnosis not present

## 2019-09-30 DIAGNOSIS — M25511 Pain in right shoulder: Secondary | ICD-10-CM

## 2019-09-30 DIAGNOSIS — M4722 Other spondylosis with radiculopathy, cervical region: Secondary | ICD-10-CM

## 2019-09-30 DIAGNOSIS — M7541 Impingement syndrome of right shoulder: Secondary | ICD-10-CM | POA: Diagnosis not present

## 2019-09-30 NOTE — Progress Notes (Signed)
Office Visit Note   Patient: Cynthia Werner           Date of Birth: 11-Dec-1945           MRN: [id number] Visit Date: 09/30/2019              Requested by: Cyndi Bender, PA-C [address] [place],  [place] [address] PCP: Cyndi Bender, PA-C   Assessment & Plan: Visit Diagnoses:  1. Acute pain of right shoulder   2. Pain in left hand   3. Neck pain   4. Impingement syndrome of right shoulder   5. Other spondylosis with radiculopathy, cervical region     Plan: With patient's multiple areas of discomfort I recommend treating this conservatively with Medrol dose pack 6-day taper. We will have her follow-up in the clinic in 2 weeks for recheck. She still continues to describe having cervical radicular symptoms in the right upper extremity and that may consider getting an MRI to rule out HNP/stenosis. Would also consider doing a right shoulder subacromial Marcaine/Depo-Medrol injection if this continues to be a problem. She does use a left wrist splint intermittently to help with her wrist and thumb pain and she may continue to use this if needed. Also consider NCV/EMG study right upper extremity in the future if needed.  Follow-Up Instructions: Return in about 2 weeks (around 10/14/2019) for with Angelette Ganus for recheck of neck, shoulder and left wrist.   Orders:  Orders Placed This Encounter  Procedures  . XR Hand Complete Left  . XR Cervical Spine 2 or 3 views  . XR Shoulder Right   No orders of the defined types were placed in this encounter.     Procedures: No procedures performed   Clinical Data: No additional findings.   Subjective: Chief Complaint  Patient presents with  . Right Shoulder - Pain  . Neck - Pain  . Left Hand - Pain    HPI 74 year old white female comes in today with complaints of right shoulder pain, right arm pain/burning and left wrist pain. Patient states that she is at least problems off and on for several weeks. She has a known history of lumbar  spondylosis but denies any previous diagnosis of cervical spine issues. Does occasionally have some neck pain. She describes a burning sensation in the upper right arm and occasionally has pain down into the elbow and wrist. Does not describe any radicular pain in the left upper extremity. Right shoulder pain also aggravated with overhead reaching. Feels like she has some "popping" at times in the shoulder. No feeling of instability. Regards to her left wrist pain she localizes this to the radial aspect. Having discomfort when she grips objects. She has worn an over-the-counter splint which does help. Not having the same burning type pain in the left wrist and hand as she does on the right. Review of Systems No current cardiac pulmonary GI GU issues  Objective: Vital Signs: Ht 5\' 3"  (1.6 m)   Wt 180 lb (81.6 kg)   BMI 31.89 kg/m   Physical Exam Constitutional:      Appearance: Normal appearance.  HENT:     Head: Normocephalic and atraumatic.  Eyes:     Extraocular Movements: Extraocular movements intact.     Pupils: Pupils are equal, round, and reactive to light.  Pulmonary:     Effort: No respiratory distress.  Musculoskeletal:     Comments: Gait is normal. Server spine good range of motion. She has moderate  right brachial plexus and trapezius tenderness. Negative on the left side. Right shoulder she has good range of motion but with discomfort. Positive impingement test. Negative drop arm test. Tender along the proximal biceps tendon. No tendon defect. Right shoulder pain with supraspinatus resistance. Left shoulder unremarkable. Bilateral elbows good range of motion. Right elbow she does have a positive Tinel's over the cubital tunnel. Negative on the left side. Bilateral wrist good range of motion. Positive Tinel's over the right carpal tunnel. Negative on the left side. Left wrist she is mildly tender over the first dorsal compartment. Also minimally tender over the right first [facility] joint.  Negative CMC grind. Good bilateral grip strength. Good bilateral triceps biceps point.  Neurological:     General: No focal deficit present.     Mental Status: She is alert and oriented to person, place, and time.  Psychiatric:        Mood and Affect: Mood normal.     Ortho Exam  Specialty Comments:  No specialty comments available.  Imaging: No results found.   PMFS History: Patient Active Problem List   Diagnosis Date Noted  . Post laminectomy syndrome 01/18/2018  . Chronic pain syndrome 01/18/2018  . HNP (herniated nucleus pulposus), lumbar 06/29/2011    Class: Diagnosis of  . PERSONAL HX COLONIC POLYPS 02/18/2009  . OBESITY 12/31/2008  . GERD 12/31/2008  . IBS 12/31/2008   Past Medical History:  Diagnosis Date  . Arthritis    low back, HNP- L5-S1  . GERD (gastroesophageal reflux disease)   . Hypertension    stress test done 10+ yrs. ago,  done due to being on a diet pill, told that it was  wnl   . Hypothyroidism   . Shortness of breath     Family History  Problem Relation Age of Onset  . Anesthesia problems Sister        father's sister didn't wake from anesthesia , pt.'s sister woke up slowly  . Hypotension Neg Hx   . Malignant hyperthermia Neg Hx   . Pseudochol deficiency Neg Hx   . Colon cancer Neg Hx     Past Surgical History:  Procedure Laterality Date  . CHOLECYSTECTOMY     2003  . FOOT NEUROMA SURGERY     2005- R foot, "nerve snipped "  . FRACTURE SURGERY     L foot, bunionectomy followed by special surgery to further stabilize the foot   . LUMBAR LAMINECTOMY  06/29/2011   x2;2nd sx at [facility] Procedure: MICRODISCECTOMY LUMBAR LAMINECTOMY;  Surgeon: Marybelle Killings, MD;  Location: [place];  Service: Orthopedics;  Laterality: Right;  Right L5-S1 Microdiscectomy  . TUBAL LIGATION     Social History   Occupational History  . Not on file  Tobacco Use  . Smoking status: Never Smoker  . Smokeless tobacco: Never Used  Substance and Sexual Activity  .  Alcohol use: No  . Drug use: No  . Sexual activity: Not on file

## 2019-10-01 MED ORDER — METHYLPREDNISOLONE 4 MG PO TABS
ORAL_TABLET | ORAL | 0 refills | Status: DC
Start: 2019-10-01 — End: 2020-07-16

## 2019-10-16 ENCOUNTER — Encounter: Payer: Self-pay | Admitting: Surgery

## 2019-10-16 ENCOUNTER — Ambulatory Visit: Payer: Medicare PPO | Admitting: Surgery

## 2019-10-16 ENCOUNTER — Other Ambulatory Visit: Payer: Self-pay

## 2019-10-16 VITALS — Ht 63.0 in | Wt 180.0 lb

## 2019-10-16 DIAGNOSIS — M4722 Other spondylosis with radiculopathy, cervical region: Secondary | ICD-10-CM

## 2019-10-16 DIAGNOSIS — M7541 Impingement syndrome of right shoulder: Secondary | ICD-10-CM

## 2019-11-27 ENCOUNTER — Ambulatory Visit: Payer: Medicare PPO | Admitting: Surgery

## 2019-12-03 NOTE — Progress Notes (Signed)
74 year old female returns for recheck of her right shoulder pain, neck pain.  States that she is doing well after completing the Medrol Dosepak.  No complaints.  She is pleased with this point.  Right shoulder good range of motion.  Negative impingement test.  She is neurologically intact.  We will have patient follow-up in 6 weeks for recheck.  Will return sooner if symptoms return and we will plan to get further imaging as discussed last office visit.  All questions answered.

## 2020-04-02 DIAGNOSIS — Z6833 Body mass index (BMI) 33.0-33.9, adult: Secondary | ICD-10-CM | POA: Diagnosis not present

## 2020-04-02 DIAGNOSIS — Z1331 Encounter for screening for depression: Secondary | ICD-10-CM | POA: Diagnosis not present

## 2020-04-02 DIAGNOSIS — K219 Gastro-esophageal reflux disease without esophagitis: Secondary | ICD-10-CM | POA: Diagnosis not present

## 2020-04-02 DIAGNOSIS — E7800 Pure hypercholesterolemia, unspecified: Secondary | ICD-10-CM | POA: Diagnosis not present

## 2020-04-02 DIAGNOSIS — I10 Essential (primary) hypertension: Secondary | ICD-10-CM | POA: Diagnosis not present

## 2020-04-02 DIAGNOSIS — Z9181 History of falling: Secondary | ICD-10-CM | POA: Diagnosis not present

## 2020-04-02 DIAGNOSIS — E039 Hypothyroidism, unspecified: Secondary | ICD-10-CM | POA: Diagnosis not present

## 2020-04-02 DIAGNOSIS — Z139 Encounter for screening, unspecified: Secondary | ICD-10-CM | POA: Diagnosis not present

## 2020-04-02 DIAGNOSIS — M545 Low back pain, unspecified: Secondary | ICD-10-CM | POA: Diagnosis not present

## 2020-04-13 ENCOUNTER — Other Ambulatory Visit: Payer: Self-pay | Admitting: Physician Assistant

## 2020-04-13 DIAGNOSIS — N959 Unspecified menopausal and perimenopausal disorder: Secondary | ICD-10-CM

## 2020-04-15 ENCOUNTER — Other Ambulatory Visit: Payer: Self-pay

## 2020-04-15 ENCOUNTER — Ambulatory Visit
Admission: RE | Admit: 2020-04-15 | Discharge: 2020-04-15 | Disposition: A | Discharge status: Home / Self Care | Payer: Medicare PPO | Source: Ambulatory Visit | Attending: Physician Assistant | Admitting: Physician Assistant

## 2020-04-15 DIAGNOSIS — Z1231 Encounter for screening mammogram for malignant neoplasm of breast: Secondary | ICD-10-CM | POA: Diagnosis not present

## 2020-04-30 DIAGNOSIS — Z0000 Encounter for general adult medical examination without abnormal findings: Secondary | ICD-10-CM | POA: Diagnosis not present

## 2020-04-30 DIAGNOSIS — E669 Obesity, unspecified: Secondary | ICD-10-CM | POA: Diagnosis not present

## 2020-04-30 DIAGNOSIS — Z1331 Encounter for screening for depression: Secondary | ICD-10-CM | POA: Diagnosis not present

## 2020-04-30 DIAGNOSIS — Z9181 History of falling: Secondary | ICD-10-CM | POA: Diagnosis not present

## 2020-04-30 DIAGNOSIS — E785 Hyperlipidemia, unspecified: Secondary | ICD-10-CM | POA: Diagnosis not present

## 2020-05-01 ENCOUNTER — Encounter: Payer: Self-pay | Admitting: Internal Medicine

## 2020-06-02 DIAGNOSIS — E7800 Pure hypercholesterolemia, unspecified: Secondary | ICD-10-CM | POA: Diagnosis not present

## 2020-06-02 DIAGNOSIS — R079 Chest pain, unspecified: Secondary | ICD-10-CM | POA: Diagnosis not present

## 2020-06-02 DIAGNOSIS — R072 Precordial pain: Secondary | ICD-10-CM | POA: Diagnosis not present

## 2020-06-02 DIAGNOSIS — R110 Nausea: Secondary | ICD-10-CM | POA: Diagnosis not present

## 2020-06-02 DIAGNOSIS — R6884 Jaw pain: Secondary | ICD-10-CM | POA: Diagnosis not present

## 2020-06-02 DIAGNOSIS — I10 Essential (primary) hypertension: Secondary | ICD-10-CM | POA: Diagnosis not present

## 2020-06-02 DIAGNOSIS — M549 Dorsalgia, unspecified: Secondary | ICD-10-CM | POA: Diagnosis not present

## 2020-06-02 DIAGNOSIS — K219 Gastro-esophageal reflux disease without esophagitis: Secondary | ICD-10-CM | POA: Diagnosis not present

## 2020-06-02 DIAGNOSIS — R6889 Other general symptoms and signs: Secondary | ICD-10-CM | POA: Diagnosis not present

## 2020-06-02 DIAGNOSIS — Z9049 Acquired absence of other specified parts of digestive tract: Secondary | ICD-10-CM | POA: Diagnosis not present

## 2020-06-02 DIAGNOSIS — Z743 Need for continuous supervision: Secondary | ICD-10-CM | POA: Diagnosis not present

## 2020-06-02 DIAGNOSIS — R0789 Other chest pain: Secondary | ICD-10-CM | POA: Diagnosis not present

## 2020-06-11 DIAGNOSIS — R079 Chest pain, unspecified: Secondary | ICD-10-CM | POA: Diagnosis not present

## 2020-06-11 DIAGNOSIS — I10 Essential (primary) hypertension: Secondary | ICD-10-CM | POA: Diagnosis not present

## 2020-06-11 DIAGNOSIS — R0602 Shortness of breath: Secondary | ICD-10-CM | POA: Diagnosis not present

## 2020-06-25 DIAGNOSIS — R0602 Shortness of breath: Secondary | ICD-10-CM | POA: Diagnosis not present

## 2020-06-25 DIAGNOSIS — I071 Rheumatic tricuspid insufficiency: Secondary | ICD-10-CM | POA: Diagnosis not present

## 2020-06-25 DIAGNOSIS — R079 Chest pain, unspecified: Secondary | ICD-10-CM | POA: Diagnosis not present

## 2020-06-25 DIAGNOSIS — I10 Essential (primary) hypertension: Secondary | ICD-10-CM | POA: Diagnosis not present

## 2020-06-25 DIAGNOSIS — I340 Nonrheumatic mitral (valve) insufficiency: Secondary | ICD-10-CM | POA: Diagnosis not present

## 2020-07-01 ENCOUNTER — Encounter: Payer: Medicare PPO | Admitting: Internal Medicine

## 2020-07-02 ENCOUNTER — Telehealth: Payer: Self-pay | Admitting: Internal Medicine

## 2020-07-02 NOTE — Telephone Encounter (Signed)
Error

## 2020-07-15 NOTE — Progress Notes (Signed)
07/15/2020 Cynthia Werner [id number] 1945/07/30   CHIEF COMPLAINT: Schedule colonoscopy  HISTORY OF PRESENT ILLNESS:  Cynthia Werner. Ziegler is a 75 year old female with a past medical history of arthritis, hypertension, hypothyroidism, GERD and colon polyps. S/P cholecystectomy 1980's, lumbar laminectomy x 2, tubal ligation and right foot neuroma surgery and left bunionectomy surgery.  She presents to our office today to schedule a colonoscopy.  Her most recent colonoscopy was 02/11/2016 and nine 2 to 6 mm tubular adenomatous/benign polypoid polyps were removed from the cecum, ascending colon and transverse colon.  She typically passes a normal formed brown bowel movement daily.  She sees bright red blood on the toilet tissue if she has diarrhea.  She has occasional fecal leakage when she bends down to pick something up from the floor.  She complains of having dysphagia.  She describes having food such as bread or [place] fries gets stuck to the upper esophagus which occurs approximately once weekly for the past year.  She drinks water and the stuck food passes.  No recent heartburn.  She is taking omeprazole 20 mg daily.  She underwent an EGD in 2010 which showed benign gastric polyps.  She developed atypical mid chest pain 06/02/2020 which she described as a squeezing type pain which lasted for approximately 10 minutes.  She has experienced similar episodes of chest pain on and off over the past few years.  She presented to [facility] for further evaluation.  A twelve-lead EKG was stable without evidence of acute ischemia, possible Q waves in the inferior leads. Troponin levels were unremarkable.  Labs showed a mildly elevated total bilirubin level 1.5 and alk phosphatase 132.  Normal AST and ALT levels.  Abdominal imaging was not done.  She underwent a cholecystectomy in the 1980s.  She was evaluated by cardiologist Dr. Tor Netters on 06/01/2020.  She underwent an echo which was normal, LVEF >  55% and aspirin was discontinued as a suspicion for CAD was quite low.  No further cardiac evaluation was recommended.   Labs 06/02/2020: WBC 7.3.  Hemoglobin 13.4.  Hematocrit 40.8.  MCV 86.6.  Platelet 195.  Sodium 140.  Potassium 3.7.  BUN 24.  Creatinine 1.0.  Albumin 4.4.  Total bili 1.5.  Alk phosphatase 132.  AST 26.  ALT 28.  Troponin less than 0.034.  Colonoscopy 02/11/2016 by Dr. Henrene Pastor: - Nine 2 to 6 mm polyps in the transverse colon, in the ascending colon and in the cecum, removed with a cold snare. Resected and retrieved. - Diverticulosis in the sigmoid colon. - The examination was otherwise normal on direct and retroflexion views. - Recall colonoscopy 3 yeas Biopsy Report: 1. Surgical [P], cecum and ascending, polyp (2) - TUBULAR ADENOMA, ONE FRAGMENT. - BENIGN POLYPOID COLORECTAL MUCOSA, ONE FRAGMENT - NO HIGH GRADE DYSPLASIA OR MALIGNANCY IDENTIFIED. 2. Surgical [P], ascending, polyp (3) - TUBULAR ADENOMA, THREE FRAGMENTS. - BENIGN COLORECTAL MUCOSA, TWO FRAGMENTS. - NO HIGH GRADE DYSPLASIA OR MALIGNANCY IDENTIFIED. 3. Surgical [P], transverse and ascending, polyp (4) - TUBULAR ADENOMA, THREE FRAGMENTS. - BENIGN COLORECTAL MUCOSA, ONE FRAGMENT - NO HIGH GRADE DYSPLASIA OR MALIGNANCY IDENTIFIED.  Colonoscopy 07/05/2005: One tubular adenomatous polyp removed from the sigmoid colon   EGD 01/07/2009: 1.  Benign gastric polyp 2.  Otherwise normal examination 3.  GERD   Past Medical History:  Diagnosis Date  . Arthritis    low back, HNP- L5-S1  . GERD (gastroesophageal reflux disease)   . Hypertension  stress test done 10+ yrs. ago,  done due to being on a diet pill, told that it was  wnl   . Hypothyroidism   . Shortness of breath    Past Surgical History:  Procedure Laterality Date  . CHOLECYSTECTOMY     2003  . FOOT NEUROMA SURGERY     2005- R foot, "nerve snipped "  . FRACTURE SURGERY     L foot, bunionectomy followed by special surgery to further stabilize  the foot   . LUMBAR LAMINECTOMY  06/29/2011   x2;2nd sx at [facility] Procedure: MICRODISCECTOMY LUMBAR LAMINECTOMY;  Surgeon: Marybelle Killings, MD;  Location: [facility];  Service: Orthopedics;  Laterality: Right;  Right L5-S1 Microdiscectomy  . TUBAL LIGATION     Social History: She is retired.  Non-smoker.  No alcohol or drug use.  Family History:  Mother age 26 alive and well. Father died age 31 MI. Brother with heart disease. Brother heart disease, COPD died from leukemia. Sister died ovarian cancer.   Allergies  Allergen Reactions  . Other       Outpatient Encounter Medications as of 07/16/2020  Medication Sig  . amLODipine-benazepril (LOTREL) 5-10 MG capsule amlodipine 5 mg-benazepril 10 mg capsule  TAKE ONE CAPSULE BY MOUTH EVERY DAY FOR HYPERTENSION  . aspirin 81 MG chewable tablet aspirin 81 mg chewable tablet  Chew 1 tablet every day by oral route.  Marland Kitchen atorvastatin (LIPITOR) 40 MG tablet atorvastatin 40 mg tablet  TAKE 1 TABLET BY MOUTH DAILY FOR CHOLESTEROL  . levothyroxine (SYNTHROID, LEVOTHROID) 50 MCG tablet levothyroxine 50 mcg tablet  TAKE 1 TABLET BY MOUTH DAILY  . methylPREDNISolone (MEDROL) 4 MG tablet 6-day taper to be taken as directed (Patient not taking: Reported on 10/16/2019)  . omeprazole (PRILOSEC) 20 MG capsule   . pregabalin (LYRICA) 75 MG capsule Lyrica 75 mg capsule  TAKE 1 CAPSULE BY MOUTH 1 - 2 TIMES DAILY FOR CHRONIC BACK PAIN/RADICULAR NERVE PAIN   Facility-Administered Encounter Medications as of 07/16/2020  Medication  . 0.9 %  sodium chloride infusion   REVIEW OF SYSTEMS:  Gen: Denies fever, sweats or chills. No weight loss.  CV: See HPI.  Resp: Denies cough, shortness of breath of hemoptysis.  GI: See HPI.  GU : Denies urinary burning, blood in urine, increased urinary frequency or incontinence. MS: Denies joint pain, muscles aches or weakness. Derm: Denies rash, itchiness, skin lesions or unhealing ulcers. Psych: Denies depression, anxiety or memory  loss. Heme: Denies bruising, bleeding. Neuro:  Denies headaches, dizziness or paresthesias. Endo:  Denies any problems with DM, thyroid or adrenal function.  PHYSICAL EXAM: BP 124/68   Pulse 85   Ht _0  (1.6 m)   Wt 190 lb (86.2 kg)   SpO2 99%   BMI 33.66 kg/m  General: Well developed 75 year old female in no acute distress. Head: Normocephalic and atraumatic. Eyes:  Sclerae non-icteric, conjunctive pink. Ears: Normal auditory acuity. Mouth: Dentition intact. No ulcers or lesions.  Neck: Supple, no lymphadenopathy or thyromegaly.  Lungs: Clear bilaterally to auscultation without wheezes, crackles or rhonchi. Heart: Regular rate and rhythm. No murmur, rub or gallop appreciated.  Abdomen: Soft, nontender, non distended. No masses. No hepatosplenomegaly. Normoactive bowel sounds x 4 quadrants.  Rectal: She declines a rectal exam today. Musculoskeletal: Symmetrical with no gross deformities. Skin: Warm and dry. No rash or lesions on visible extremities. Extremities: No edema. Neurological: Alert oriented x 4, no focal deficits.  Psychological:  Alert and cooperative. Normal  mood and affect.  ASSESSMENT AND PLAN:  23.  75 year old female with a history of tubular adenomatous colon polyps due for a colonoscopy. -Colonoscopy benefits and risks discussed including risk with sedation, risk of bleeding, perforation and infection -Benefiber 1 tablespoon daily  2.  GERD. Dysphagia. -EGD with possible esophageal dilatation at time of colonoscopy. EGD benefits and risks discussed including risk with sedation, risk of bleeding, perforation and infection  -Continue Omeprazole 20 mg daily  3.  Atypical chest.  Negative cardiac evaluation.  Past CCY. Rule out esophageal vs biliary etiology. Suspect possible stones in the common bile duct which passed spontaneously in setting of chest pain with mildly elevated T. bil and Alk phos levels done in the ED 06/02/2020.  -Hepatic panel -RUQ sonogram  with focus on the common bile duct  -See Plan in # 2  Further follow-up to be determined after the above evaluation completed.  The patient will call our office if symptoms worsen.             CC:  Cyndi Bender, PA-C

## 2020-07-16 ENCOUNTER — Other Ambulatory Visit: Payer: Self-pay

## 2020-07-16 ENCOUNTER — Encounter: Payer: Self-pay | Admitting: Nurse Practitioner

## 2020-07-16 ENCOUNTER — Other Ambulatory Visit (INDEPENDENT_AMBULATORY_CARE_PROVIDER_SITE_OTHER): Payer: Medicare PPO

## 2020-07-16 ENCOUNTER — Ambulatory Visit: Payer: Medicare PPO | Admitting: Nurse Practitioner

## 2020-07-16 VITALS — BP 124/68 | HR 85 | Ht 63.0 in | Wt 190.0 lb

## 2020-07-16 DIAGNOSIS — K219 Gastro-esophageal reflux disease without esophagitis: Secondary | ICD-10-CM | POA: Diagnosis not present

## 2020-07-16 DIAGNOSIS — Z860100 Personal history of colon polyps, unspecified: Secondary | ICD-10-CM

## 2020-07-16 DIAGNOSIS — R17 Unspecified jaundice: Secondary | ICD-10-CM | POA: Diagnosis not present

## 2020-07-16 DIAGNOSIS — R1310 Dysphagia, unspecified: Secondary | ICD-10-CM

## 2020-07-16 DIAGNOSIS — R748 Abnormal levels of other serum enzymes: Secondary | ICD-10-CM

## 2020-07-16 LAB — HEPATIC FUNCTION PANEL
ALT: 14 U/L (ref 0–35)
AST: 13 U/L (ref 0–37)
Albumin: 3.9 g/dL (ref 3.5–5.2)
Alkaline Phosphatase: 120 U/L — ABNORMAL HIGH (ref 39–117)
Bilirubin, Direct: 0.2 mg/dL (ref 0.0–0.3)
Total Bilirubin: 1.3 mg/dL — ABNORMAL HIGH (ref 0.2–1.2)
Total Protein: 6.6 g/dL (ref 6.0–8.3)

## 2020-07-16 MED ORDER — SUTAB 1479-225-188 MG PO TABS: 1.0000 | ORAL_TABLET | ORAL | 0 refills | Status: DC

## 2020-07-16 NOTE — Patient Instructions (Addendum)
If you are age 74 or older, your body mass index should be between 23-30. Your Body mass index is 33.66 kg/m. If this is out of the aforementioned range listed, please consider follow up with your Primary Care Provider.  If you are age 68 or younger, your body mass index should be between 19-25. Your Body mass index is 33.66 kg/m. If this is out of the aformentioned range listed, please consider follow up with your Primary Care Provider.   ULTRASOUND  You have been scheduled for an abdominal ultrasound at [facility] (1st floor of hospital) on 07/20/20 at 9:30AM.   Please arrive 15 minutes prior to your appointment for registration.  Make certain not to have anything to eat or drink after midnight prior to your appointment.  Should you need to reschedule your appointment, please contact radiology at [phone]. This test typically takes about 30 minutes to perform.  PROCEDURES:  You have been scheduled for an endoscopy and colonoscopy. Please follow the written instructions given to you at your visit today. Please pick up your prep supplies at the pharmacy within the next 1-3 days. If you use inhalers (even only as needed), please bring them with you on the day of your procedure.  LABS:   Labwork has been ordered for you today.  Press "B" on the elevator. The lab is located at the first door on the left as you exit the elevator.  HEALTHCARE LAWS AND MY CHART RESULTS: Due to recent changes in healthcare laws, you may see the results of your imaging and laboratory studies on MyChart before your provider has had a chance to review them.   We understand that in some cases there may be results that are confusing or concerning to you. Not all laboratory results come back in the same time frame and the provider may be waiting for multiple results in order to interpret others.  Please give [place] 48 hours in order for your provider to thoroughly review all the results before contacting the  office for clarification of your results.   Continue Omeprazole 20 MG once a day.  Benefiber- 1 tablespoon daily.  Please call our office if your symptoms worsen. It was great seeing you today!  Thank you for entrusting me with your care and choosing [facility].  Noralyn Pick, CRNP

## 2020-07-16 NOTE — Progress Notes (Signed)
Assessment and plan reviewed 

## 2020-07-20 ENCOUNTER — Other Ambulatory Visit: Payer: Self-pay

## 2020-07-20 ENCOUNTER — Ambulatory Visit (HOSPITAL_COMMUNITY)
Admission: RE | Admit: 2020-07-20 | Discharge: 2020-07-20 | Disposition: A | Discharge status: Home / Self Care | Payer: Medicare PPO | Source: Ambulatory Visit | Attending: Nurse Practitioner | Admitting: Nurse Practitioner

## 2020-07-20 DIAGNOSIS — R1310 Dysphagia, unspecified: Secondary | ICD-10-CM | POA: Diagnosis not present

## 2020-07-20 DIAGNOSIS — R748 Abnormal levels of other serum enzymes: Secondary | ICD-10-CM | POA: Diagnosis not present

## 2020-07-20 DIAGNOSIS — Z86010 Personal history of colonic polyps: Secondary | ICD-10-CM | POA: Insufficient documentation

## 2020-07-20 DIAGNOSIS — R17 Unspecified jaundice: Secondary | ICD-10-CM | POA: Diagnosis not present

## 2020-07-20 DIAGNOSIS — R7989 Other specified abnormal findings of blood chemistry: Secondary | ICD-10-CM | POA: Diagnosis not present

## 2020-07-20 DIAGNOSIS — K219 Gastro-esophageal reflux disease without esophagitis: Secondary | ICD-10-CM | POA: Diagnosis not present

## 2020-07-24 ENCOUNTER — Other Ambulatory Visit: Payer: Self-pay | Admitting: Physician Assistant

## 2020-07-24 DIAGNOSIS — E2839 Other primary ovarian failure: Secondary | ICD-10-CM

## 2020-07-28 ENCOUNTER — Ambulatory Visit
Admission: RE | Admit: 2020-07-28 | Discharge: 2020-07-28 | Disposition: A | Discharge status: Home / Self Care | Payer: Medicare PPO | Source: Ambulatory Visit | Attending: Physician Assistant | Admitting: Physician Assistant

## 2020-07-28 ENCOUNTER — Other Ambulatory Visit: Payer: Self-pay

## 2020-07-28 DIAGNOSIS — Z780 Asymptomatic menopausal state: Secondary | ICD-10-CM | POA: Diagnosis not present

## 2020-07-28 DIAGNOSIS — E2839 Other primary ovarian failure: Secondary | ICD-10-CM

## 2020-07-28 DIAGNOSIS — M85852 Other specified disorders of bone density and structure, left thigh: Secondary | ICD-10-CM | POA: Diagnosis not present

## 2020-08-31 ENCOUNTER — Ambulatory Visit (AMBULATORY_SURGERY_CENTER): Payer: Medicare PPO | Admitting: Internal Medicine

## 2020-08-31 ENCOUNTER — Encounter: Payer: Self-pay | Admitting: Internal Medicine

## 2020-08-31 ENCOUNTER — Other Ambulatory Visit: Payer: Self-pay

## 2020-08-31 VITALS — BP 126/51 | HR 62 | Temp 97.1°F | Resp 15 | Ht 63.0 in | Wt 190.0 lb

## 2020-08-31 DIAGNOSIS — D125 Benign neoplasm of sigmoid colon: Secondary | ICD-10-CM

## 2020-08-31 DIAGNOSIS — K449 Diaphragmatic hernia without obstruction or gangrene: Secondary | ICD-10-CM | POA: Diagnosis not present

## 2020-08-31 DIAGNOSIS — D120 Benign neoplasm of cecum: Secondary | ICD-10-CM

## 2020-08-31 DIAGNOSIS — R1310 Dysphagia, unspecified: Secondary | ICD-10-CM | POA: Diagnosis not present

## 2020-08-31 DIAGNOSIS — Z86010 Personal history of colonic polyps: Secondary | ICD-10-CM | POA: Diagnosis not present

## 2020-08-31 DIAGNOSIS — K219 Gastro-esophageal reflux disease without esophagitis: Secondary | ICD-10-CM

## 2020-08-31 MED ORDER — SODIUM CHLORIDE 0.9 % IV SOLN: 500.0000 mL | Freq: Once | INTRAVENOUS | Status: DC

## 2020-08-31 NOTE — Op Note (Signed)
[facility] Patient Name: Cynthia Werner Procedure Date: 08/31/2020 2:05 PM MRN: [id number] Endoscopist: Docia Chuck. Henrene Pastor , MD Age: 75 Referring MD:  Date of Birth: 03/29/1946 Gender: Female Account #: [id number] Procedure:                Colonoscopy with cold snare polypectomy x 2 Indications:              High risk colon cancer surveillance: Personal                            history of multiple (3 or more) adenomas. Previous                            examinations 2007, 2017 Medicines:                Monitored Anesthesia Care Procedure:                Pre-Anesthesia Assessment:                           - Prior to the procedure, a History and Physical                            was performed, and patient medications and                            allergies were reviewed. The patient's tolerance of                            previous anesthesia was also reviewed. The risks                            and benefits of the procedure and the sedation                            options and risks were discussed with the patient.                            All questions were answered, and informed consent                            was obtained. Prior Anticoagulants: The patient has                            taken no previous anticoagulant or antiplatelet                            agents. ASA Grade Assessment: II - A patient with                            mild systemic disease. After reviewing the risks                            and benefits, the patient was deemed in  satisfactory condition to undergo the procedure.                           After obtaining informed consent, the colonoscope                            was passed under direct vision. Throughout the                            procedure, the patient's blood pressure, pulse, and                            oxygen saturations were monitored continuously. The                            Olympus  CF-HQ190 [id number]) Colonoscope was                            introduced through the anus and advanced to the the                            cecum, identified by appendiceal orifice and                            ileocecal valve. The ileocecal valve, appendiceal                            orifice, and rectum were photographed. The quality                            of the bowel preparation was excellent. The                            colonoscopy was performed without difficulty. The                            patient tolerated the procedure well. The bowel                            preparation used was SUPREP via split dose                            instruction. Scope In: 2:14:25 PM Scope Out: 2:29:18 PM Scope Withdrawal Time: 0 hours 10 minutes 54 seconds  Total Procedure Duration: 0 hours 14 minutes 53 seconds  Findings:                 Two polyps were found in the sigmoid colon and                            cecum. The polyps were 2 to 3 mm in size. These                            polyps were removed with a cold snare. Resection  and retrieval were complete.                           There were a few scattered diverticula. The most                            distal rectal mucosa had prolapse-like change. The                            exam was otherwise normal on direct and retroflexed                            views. Complications:            No immediate complications. Estimated blood loss:                            None. Estimated Blood Loss:     Estimated blood loss: none. Impression:               - Two 2 to 3 mm polyps in the sigmoid colon and in                            the cecum, removed with a cold snare. Resected and                            retrieved.                           - Scattered diverticulosis                           - Mucosal prolapse changes (mild) in the rectum                           - The examination was otherwise  normal on direct                            and retroflexion views. Recommendation:           - Repeat colonoscopy in 5 years for surveillance.                           - Patient has a contact number available for                            emergencies. The signs and symptoms of potential                            delayed complications were discussed with the                            patient. Return to normal activities tomorrow.                            Written discharge instructions were provided to the  patient.                           - Resume previous diet.                           - Continue present medications.                           - Await pathology results. Docia Chuck. Henrene Pastor, MD 08/31/2020 2:38:52 PM This report has been signed electronically.

## 2020-08-31 NOTE — Patient Instructions (Signed)
Handouts given:  Post dilation diet, polyps, diverticulosis Start dilation for all of today  Continue current medication Await pathology results YOU HAD AN ENDOSCOPIC PROCEDURE TODAY AT [facility]:   Refer to the procedure report that was given to you for any specific questions about what was found during the examination.  If the procedure report does not answer your questions, please call your gastroenterologist to clarify.  If you requested that your care partner not be given the details of your procedure findings, then the procedure report has been included in a sealed envelope for you to review at your convenience later.  YOU SHOULD EXPECT: Some feelings of bloating in the abdomen. Passage of more gas than usual.  Walking can help get rid of the air that was put into your GI tract during the procedure and reduce the bloating. If you had a lower endoscopy (such as a colonoscopy or flexible sigmoidoscopy) you may notice spotting of blood in your stool or on the toilet paper. If you underwent a bowel prep for your procedure, you may not have a normal bowel movement for a few days.  Please Note:  You might notice some irritation and congestion in your nose or some drainage.  This is from the oxygen used during your procedure.  There is no need for concern and it should clear up in a day or so.  SYMPTOMS TO REPORT IMMEDIATELY:   Following lower endoscopy (colonoscopy or flexible sigmoidoscopy):  Excessive amounts of blood in the stool  Significant tenderness or worsening of abdominal pains  Swelling of the abdomen that is new, acute  Fever of 100F or higher   Following upper endoscopy (EGD)  Vomiting of blood or coffee ground material  New chest pain or pain under the shoulder blades  Painful or persistently difficult swallowing  New shortness of breath  Fever of 100F or higher  Black, tarry-looking stools  For urgent or emergent issues, a gastroenterologist can be  reached at any hour by calling [phone]. Do not use MyChart messaging for urgent concerns.   DIET:  We do recommend a small meal at first, but then you may proceed to your regular diet.  Drink plenty of fluids but you should avoid alcoholic beverages for 24 hours.  ACTIVITY:  You should plan to take it easy for the rest of today and you should NOT DRIVE or use heavy machinery until tomorrow (because of the sedation medicines used during the test).    FOLLOW UP: Our staff will call the number listed on your records 48-72 hours following your procedure to check on you and address any questions or concerns that you may have regarding the information given to you following your procedure. If we do not reach you, we will leave a message.  We will attempt to reach you two times.  During this call, we will ask if you have developed any symptoms of COVID 19. If you develop any symptoms (ie: fever, flu-like symptoms, shortness of breath, cough etc.) before then, please call [phone].  If you test positive for Covid 19 in the 2 weeks post procedure, please call and report this information to [place].    If any biopsies were taken you will be contacted by phone or by letter within the next 1-3 weeks.  Please call us at [phone] if you have not heard about the biopsies in 3 weeks.   SIGNATURES/CONFIDENTIALITY: You and/or your care partner have signed paperwork which will be  entered into your electronic medical record.  These signatures attest to the fact that that the information above on your After Visit Summary has been reviewed and is understood.  Full responsibility of the confidentiality of this discharge information lies with you and/or your care-partner. 

## 2020-08-31 NOTE — Progress Notes (Signed)
Called to room to assist during endoscopic procedure.  Patient ID and intended procedure confirmed with present staff. Received instructions for my participation in the procedure from the performing physician.  

## 2020-08-31 NOTE — Progress Notes (Signed)
VS taken by C.W. 

## 2020-08-31 NOTE — Op Note (Signed)
[facility] Patient Name: Cynthia Werner Procedure Date: 08/31/2020 2:04 PM MRN: [id number] Endoscopist: Docia Chuck. Henrene Pastor , MD Age: 75 Referring MD:  Date of Birth: 1946/05/26 Gender: Female Account #: [id number] Procedure:                Upper GI endoscopy Indications:              Dysphagia Medicines:                Monitored Anesthesia Care Procedure:                Pre-Anesthesia Assessment:                           - Prior to the procedure, a History and Physical                            was performed, and patient medications and                            allergies were reviewed. The patient's tolerance of                            previous anesthesia was also reviewed. The risks                            and benefits of the procedure and the sedation                            options and risks were discussed with the patient.                            All questions were answered, and informed consent                            was obtained. Prior Anticoagulants: The patient has                            taken no previous anticoagulant or antiplatelet                            agents. ASA Grade Assessment: II - A patient with                            mild systemic disease. After reviewing the risks                            and benefits, the patient was deemed in                            satisfactory condition to undergo the procedure.                           After obtaining informed consent, the endoscope was  passed under direct vision. Throughout the                            procedure, the patient's blood pressure, pulse, and                            oxygen saturations were monitored continuously. The                            Endoscope was introduced through the mouth, and                            advanced to the second part of duodenum. The upper                            GI endoscopy was accomplished without  difficulty.                            The patient tolerated the procedure well. Scope In: Scope Out: Findings:                 The esophagus was grossly normal. No obvious                            stricturing or extrinsic compression. After                            completing the endoscopic survey, the scope was                            withdrawn. Dilation was performed with a Maloney                            dilator with no resistance at 32 Fr.                           The stomach revealed a small hiatal hernia. As                            well, benign fundic gland type polyps. The exam was                            otherwise normal.                           The examined duodenum was normal.                           The cardia and gastric fundus were normal on                            retroflexion. Complications:            No immediate complications. Estimated Blood Loss:     Estimated blood loss: none. Impression:  1. Dysphagia with grossly normal esophagus status                            post empiric dilation                           2. Otherwise unremarkable EGD. Incidental findings                            as noted above. Recommendation:           - Patient has a contact number available for                            emergencies. The signs and symptoms of potential                            delayed complications were discussed with the                            patient. Return to normal activities tomorrow.                            Written discharge instructions were provided to the                            patient.                           - Post dilation diet.                           - Continue present medications. Docia Chuck. Henrene Pastor, MD 08/31/2020 2:47:56 PM This report has been signed electronically.

## 2020-08-31 NOTE — Progress Notes (Signed)
PT taken to PACU. Monitors in place. VSS. Report given to RN. 

## 2020-09-02 ENCOUNTER — Telehealth: Payer: Self-pay | Admitting: *Deleted

## 2020-09-02 NOTE — Telephone Encounter (Signed)
  Follow up Call-  Call back number 08/31/2020  Post procedure Call Back phone  # [phone]  Permission to leave phone message Yes  Some recent data might be hidden     Patient questions:  Do you have a fever, pain , or abdominal swelling? No. Pain Score  0 *  Have you tolerated food without any problems? Yes.    Have you been able to return to your normal activities? Yes.    Do you have any questions about your discharge instructions: Diet   No. Medications  No. Follow up visit  No.  Do you have questions or concerns about your Care? No.  Actions: * If pain score is 4 or above: No action needed, pain <4.  1. Have you developed a fever since your procedure? NO  2.   Have you had an respiratory symptoms (SOB or cough) since your procedure? NO  3.   Have you tested positive for COVID 19 since your procedure NO  4.   Have you had any family members/close contacts diagnosed with the COVID 19 since your procedure?  NO   If yes to any of these questions please route to Joylene John, RN and Joella Prince, RN

## 2020-09-07 ENCOUNTER — Encounter: Payer: Self-pay | Admitting: Internal Medicine

## 2020-10-01 DIAGNOSIS — Z6835 Body mass index (BMI) 35.0-35.9, adult: Secondary | ICD-10-CM | POA: Diagnosis not present

## 2020-10-01 DIAGNOSIS — K219 Gastro-esophageal reflux disease without esophagitis: Secondary | ICD-10-CM | POA: Diagnosis not present

## 2020-10-01 DIAGNOSIS — E039 Hypothyroidism, unspecified: Secondary | ICD-10-CM | POA: Diagnosis not present

## 2020-10-01 DIAGNOSIS — E7800 Pure hypercholesterolemia, unspecified: Secondary | ICD-10-CM | POA: Diagnosis not present

## 2020-10-01 DIAGNOSIS — M8580 Other specified disorders of bone density and structure, unspecified site: Secondary | ICD-10-CM | POA: Diagnosis not present

## 2020-10-01 DIAGNOSIS — M545 Low back pain, unspecified: Secondary | ICD-10-CM | POA: Diagnosis not present

## 2020-10-01 DIAGNOSIS — I10 Essential (primary) hypertension: Secondary | ICD-10-CM | POA: Diagnosis not present

## 2020-10-30 DIAGNOSIS — L821 Other seborrheic keratosis: Secondary | ICD-10-CM | POA: Diagnosis not present

## 2020-10-30 DIAGNOSIS — L570 Actinic keratosis: Secondary | ICD-10-CM | POA: Diagnosis not present

## 2020-10-30 DIAGNOSIS — D1801 Hemangioma of skin and subcutaneous tissue: Secondary | ICD-10-CM | POA: Diagnosis not present

## 2020-10-30 DIAGNOSIS — L814 Other melanin hyperpigmentation: Secondary | ICD-10-CM | POA: Diagnosis not present

## 2021-01-13 DIAGNOSIS — H52203 Unspecified astigmatism, bilateral: Secondary | ICD-10-CM | POA: Diagnosis not present

## 2021-01-13 DIAGNOSIS — H04123 Dry eye syndrome of bilateral lacrimal glands: Secondary | ICD-10-CM | POA: Diagnosis not present

## 2021-04-07 DIAGNOSIS — K219 Gastro-esophageal reflux disease without esophagitis: Secondary | ICD-10-CM | POA: Diagnosis not present

## 2021-04-07 DIAGNOSIS — Z6835 Body mass index (BMI) 35.0-35.9, adult: Secondary | ICD-10-CM | POA: Diagnosis not present

## 2021-04-07 DIAGNOSIS — Z139 Encounter for screening, unspecified: Secondary | ICD-10-CM | POA: Diagnosis not present

## 2021-04-07 DIAGNOSIS — M545 Low back pain, unspecified: Secondary | ICD-10-CM | POA: Diagnosis not present

## 2021-04-07 DIAGNOSIS — E7800 Pure hypercholesterolemia, unspecified: Secondary | ICD-10-CM | POA: Diagnosis not present

## 2021-04-07 DIAGNOSIS — E039 Hypothyroidism, unspecified: Secondary | ICD-10-CM | POA: Diagnosis not present

## 2021-04-07 DIAGNOSIS — I10 Essential (primary) hypertension: Secondary | ICD-10-CM | POA: Diagnosis not present

## 2021-04-07 DIAGNOSIS — Z79899 Other long term (current) drug therapy: Secondary | ICD-10-CM | POA: Diagnosis not present

## 2021-04-07 DIAGNOSIS — M8580 Other specified disorders of bone density and structure, unspecified site: Secondary | ICD-10-CM | POA: Diagnosis not present

## 2021-05-27 DIAGNOSIS — Z6835 Body mass index (BMI) 35.0-35.9, adult: Secondary | ICD-10-CM | POA: Diagnosis not present

## 2021-05-27 DIAGNOSIS — Z0000 Encounter for general adult medical examination without abnormal findings: Secondary | ICD-10-CM | POA: Diagnosis not present

## 2021-05-27 DIAGNOSIS — Z9181 History of falling: Secondary | ICD-10-CM | POA: Diagnosis not present

## 2021-05-27 DIAGNOSIS — Z1331 Encounter for screening for depression: Secondary | ICD-10-CM | POA: Diagnosis not present

## 2021-05-27 DIAGNOSIS — E669 Obesity, unspecified: Secondary | ICD-10-CM | POA: Diagnosis not present

## 2021-05-27 DIAGNOSIS — E785 Hyperlipidemia, unspecified: Secondary | ICD-10-CM | POA: Diagnosis not present

## 2021-06-09 ENCOUNTER — Other Ambulatory Visit: Payer: Self-pay | Admitting: Family Medicine

## 2021-06-09 DIAGNOSIS — Z1231 Encounter for screening mammogram for malignant neoplasm of breast: Secondary | ICD-10-CM

## 2021-10-07 DIAGNOSIS — M545 Low back pain, unspecified: Secondary | ICD-10-CM | POA: Diagnosis not present

## 2021-10-07 DIAGNOSIS — E7800 Pure hypercholesterolemia, unspecified: Secondary | ICD-10-CM | POA: Diagnosis not present

## 2021-10-07 DIAGNOSIS — K219 Gastro-esophageal reflux disease without esophagitis: Secondary | ICD-10-CM | POA: Diagnosis not present

## 2021-10-07 DIAGNOSIS — Z6835 Body mass index (BMI) 35.0-35.9, adult: Secondary | ICD-10-CM | POA: Diagnosis not present

## 2021-10-07 DIAGNOSIS — I10 Essential (primary) hypertension: Secondary | ICD-10-CM | POA: Diagnosis not present

## 2021-10-07 DIAGNOSIS — E039 Hypothyroidism, unspecified: Secondary | ICD-10-CM | POA: Diagnosis not present

## 2021-10-07 DIAGNOSIS — M8580 Other specified disorders of bone density and structure, unspecified site: Secondary | ICD-10-CM | POA: Diagnosis not present

## 2021-11-03 DIAGNOSIS — L814 Other melanin hyperpigmentation: Secondary | ICD-10-CM | POA: Diagnosis not present

## 2021-11-03 DIAGNOSIS — L821 Other seborrheic keratosis: Secondary | ICD-10-CM | POA: Diagnosis not present

## 2021-11-03 DIAGNOSIS — L570 Actinic keratosis: Secondary | ICD-10-CM | POA: Diagnosis not present

## 2021-11-03 DIAGNOSIS — L578 Other skin changes due to chronic exposure to nonionizing radiation: Secondary | ICD-10-CM | POA: Diagnosis not present

## 2021-11-09 ENCOUNTER — Ambulatory Visit
Admission: RE | Admit: 2021-11-09 | Discharge: 2021-11-09 | Disposition: A | Discharge status: Home / Self Care | Payer: Medicare PPO | Source: Ambulatory Visit | Attending: Family Medicine | Admitting: Family Medicine

## 2021-11-09 DIAGNOSIS — Z1231 Encounter for screening mammogram for malignant neoplasm of breast: Secondary | ICD-10-CM

## 2021-11-16 ENCOUNTER — Ambulatory Visit: Payer: Medicare PPO | Admitting: Podiatry

## 2021-11-16 ENCOUNTER — Ambulatory Visit (INDEPENDENT_AMBULATORY_CARE_PROVIDER_SITE_OTHER): Payer: Medicare PPO

## 2021-11-16 ENCOUNTER — Encounter: Payer: Self-pay | Admitting: Podiatry

## 2021-11-16 DIAGNOSIS — M2011 Hallux valgus (acquired), right foot: Secondary | ICD-10-CM | POA: Diagnosis not present

## 2021-11-16 DIAGNOSIS — M21611 Bunion of right foot: Secondary | ICD-10-CM

## 2021-11-16 DIAGNOSIS — M2041 Other hammer toe(s) (acquired), right foot: Secondary | ICD-10-CM

## 2021-11-16 DIAGNOSIS — M21961 Unspecified acquired deformity of right lower leg: Secondary | ICD-10-CM

## 2021-11-16 NOTE — Progress Notes (Signed)
  Subjective:  Patient ID: Cynthia Werner, female    DOB: 07/04/1945,  MRN: [id number]  Chief Complaint  Patient presents with   Hammer Toe   Foot Swelling    76 y.o. female presents with the above complaint. History confirmed with patient.  The second and third toes on the right foot have become very painful.  The hallux rubs on the second toe as well.  She had previous left foot surgery many years ago.  Objective:  Physical Exam: warm, good capillary refill, no trophic changes or ulcerative lesions, normal DP and PT pulses, and normal sensory exam.  Right Foot: She has hallux abduction, semirigid hammertoe deformity of the second toe and third toe, flexible mallet toe deformity of the fourth and fifth toes.  There is significant pain in the PIPJ of the second and third toes    Radiographs: Multiple views x-ray of the right foot: She has hallux interphalangeus deformity with abduction of the hallux, MTPJ is fairly congruent at this level, hammertoe deformities with arthritic change in the PIPJ of the third and second toe, elongation of the second metatarsal compared to the first and third Assessment:   1. Hammertoe of second toe of right foot   2. Acquired hallux interphalangeus, right   3. Hallux valgus with bunions, right   4. Deformity of metatarsal bone of right foot      Plan:  Patient was evaluated and treated and all questions answered.  Discussed the etiology and treatment including surgical and non surgical treatment for painful hammertoes and her hallux interphalangeus deformity.  She has exhausted all non surgical treatment prior to this visit including shoe gear changes and padding.  She desires surgical intervention. We discussed all risks including but not limited to: pain, swelling, infection, scar, numbness which may be temporary or permanent, chronic pain, stiffness, nerve pain or damage, wound healing problems, bone healing problems including delayed or non-union  and recurrence. Specifically we discussed the following procedures: Right foot Akin bunionectomy by phalangeal osteotomy, second and third hammertoe correction, Weil osteotomy second metatarsal. Informed consent was signed today. Surgery will be scheduled at a mutually agreeable date. Information regarding this will be forwarded to our surgery scheduler.   Surgical plan:  Procedure: -Right foot Akin bunionectomy by phalangeal osteotomy, second and third hammertoe correction, Weil osteotomy second metatarsal  Location: -[facility]  Anesthesia plan: -IV sedation with local  Postoperative pain plan: - Tylenol 1000 mg every 6 hours, ibuprofen 600 mg every 6 hours, gabapentin 300 mg every 8 hours x5 days, oxycodone 5 mg 1-2 tabs every 6 hours only as needed  DVT prophylaxis: -None required  WB Restrictions / DME needs: -WBAT in CAM boot this has been dispensed today as well  No follow-ups on file.

## 2021-12-02 ENCOUNTER — Telehealth: Payer: Self-pay

## 2021-12-02 NOTE — Telephone Encounter (Signed)
DOS 12/17/2021   86282 2 units approved Correction, hammertoe (eg, interphalangeal fusion, partial or total phalangectomy)  28308 1 units approved Osteotomy, with or without lengthening, shortening or angular correction, metatarsal; other than first metatarsal, each  41753 1 units approved Correction, hallux valgus (bunionectomy), with sesamoidectomy, when performed; with proximal phalanx osteotomy, any method  Service summary Created on 12/02/2021  Dates of service 12/17/2021 - 02/17/2022  Authorization number [employer] - [id number]

## 2021-12-17 IMAGING — US US ABDOMEN COMPLETE
1 series · 14 of 25 positions shown · non-contrast
Comparison: None.

CLINICAL DATA: Elevated LFTs.  Evaluate common bile duct.

EXAM:
ABDOMEN ULTRASOUND COMPLETE

[Series 1: us abdomen complete · 14 of 79 slices shown]
[im 1/79]
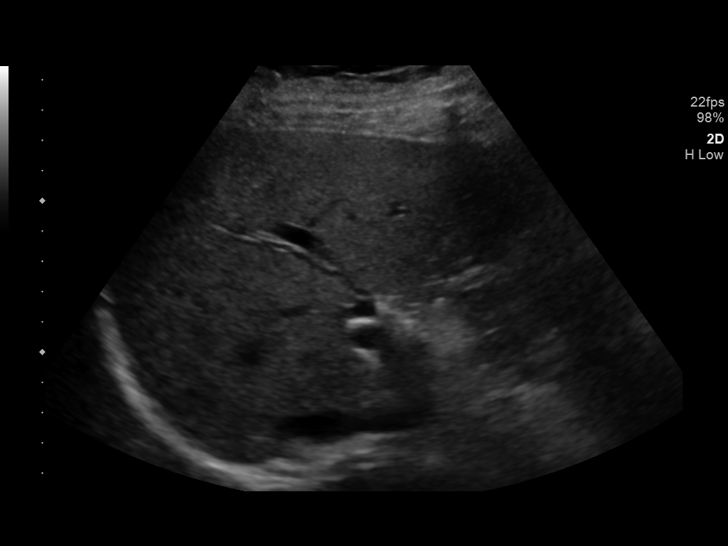
[im 7/79]
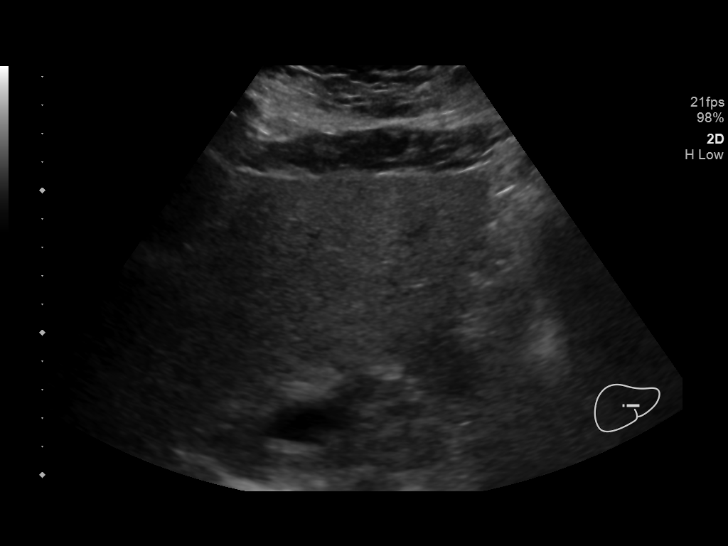
[im 14/79]
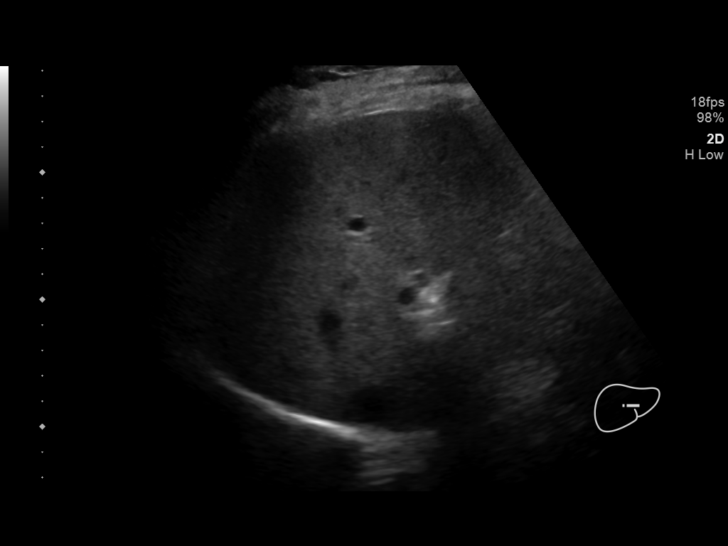
[im 20/79]
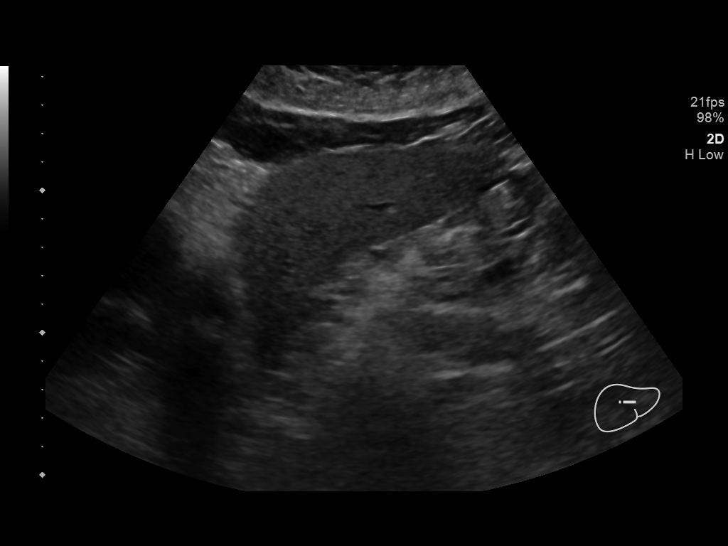
[im 27/79]
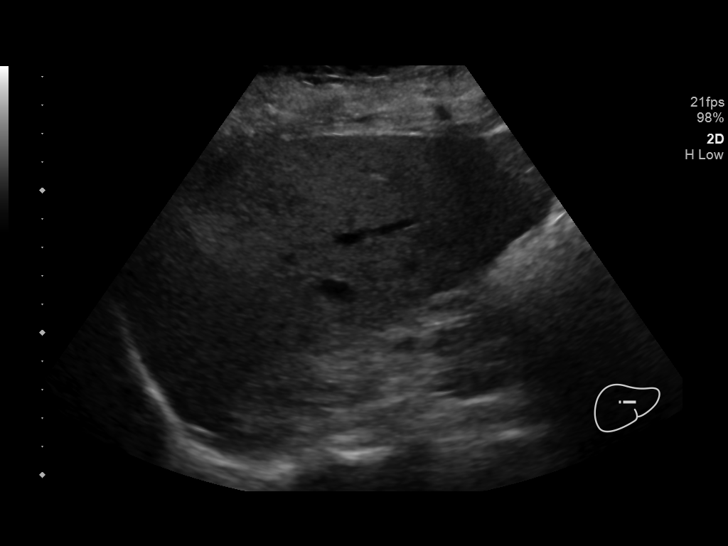
[im 30/79]
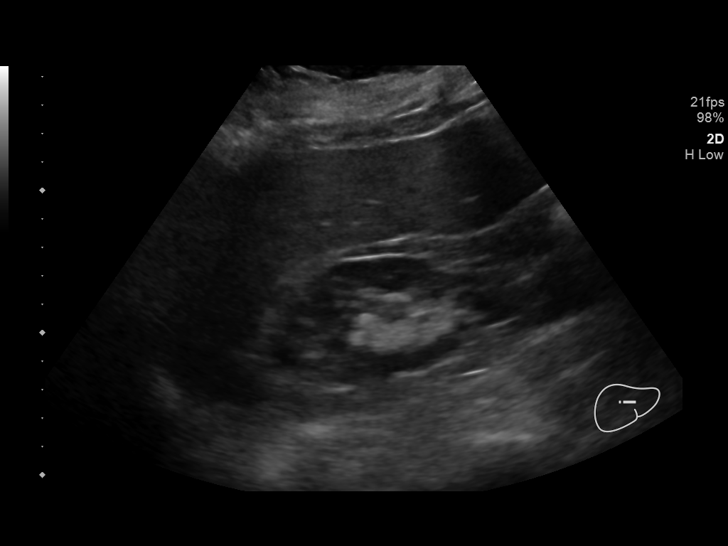
[im 36/79]
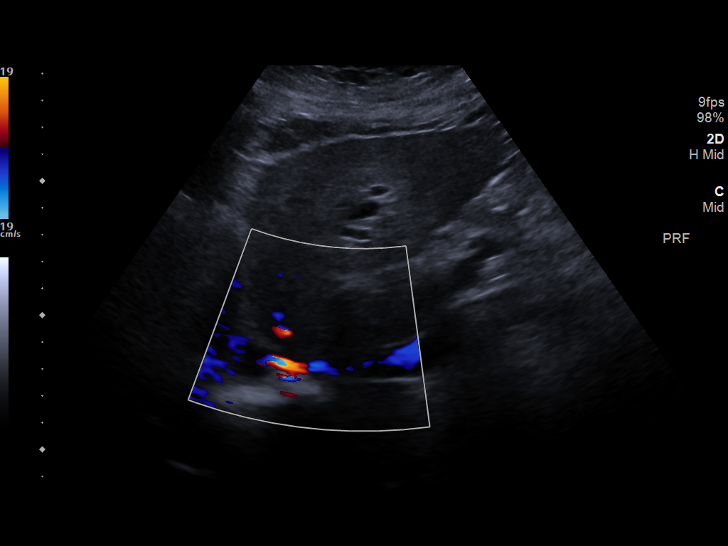
[im 43/79]
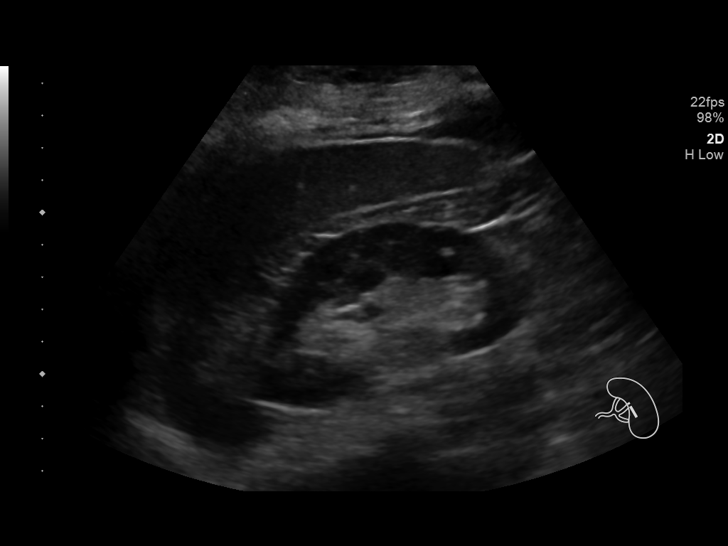
[im 49/79]
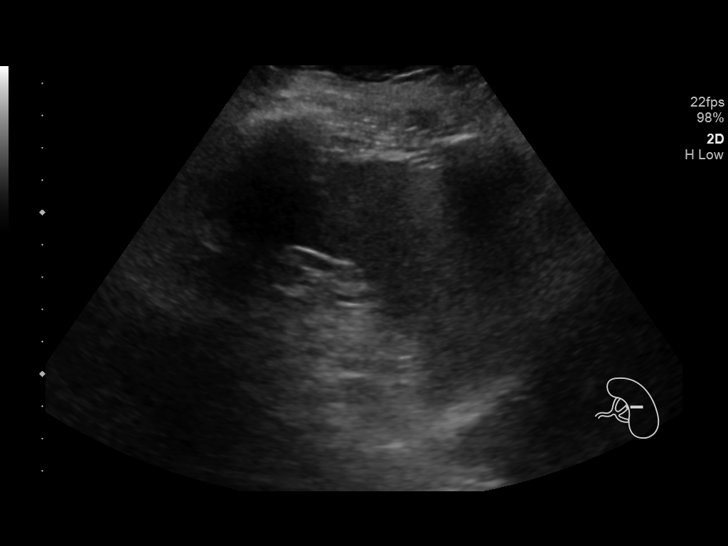
[im 53/79]
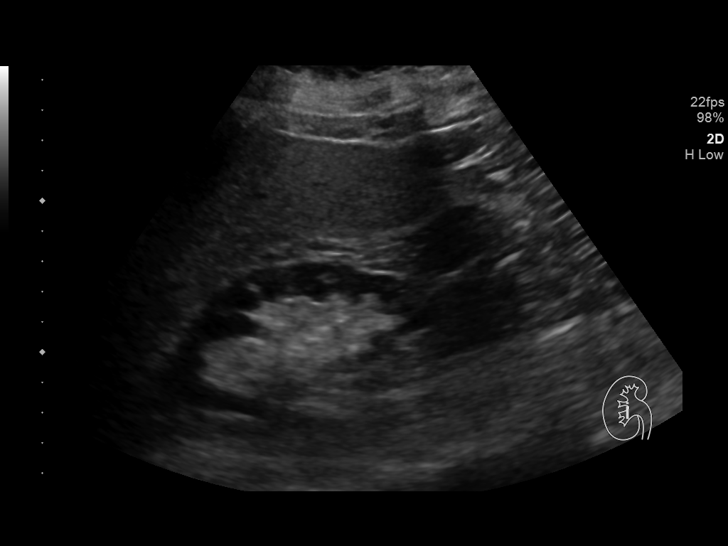
[im 59/79]
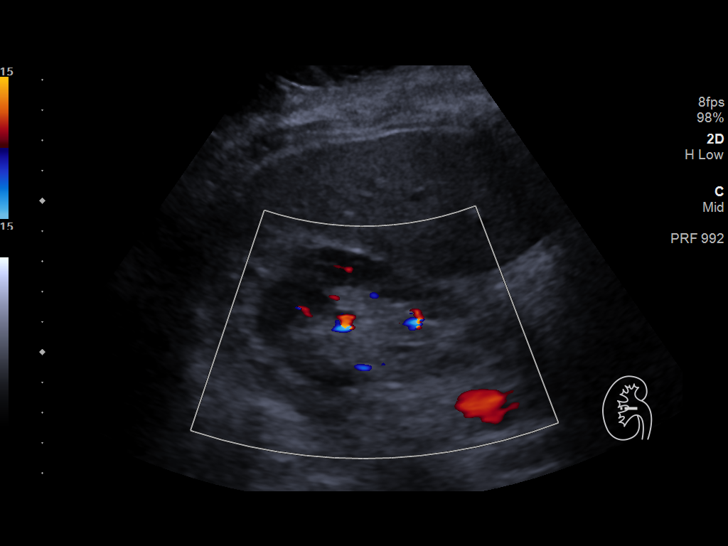
[im 66/79]
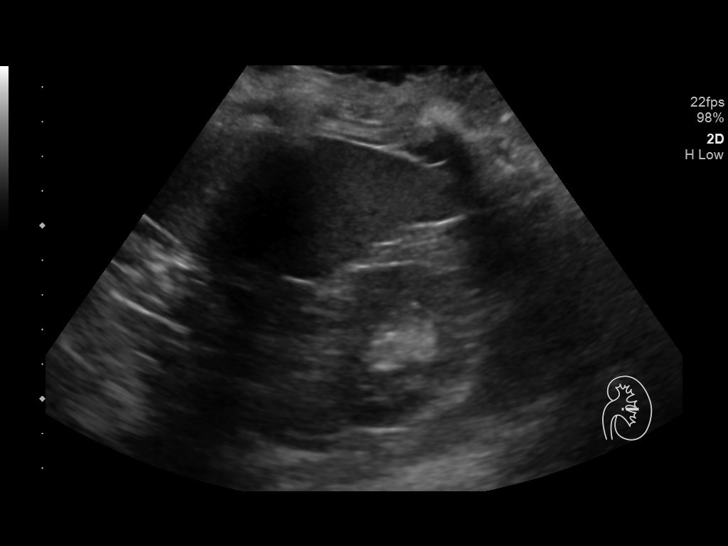
[im 72/79]
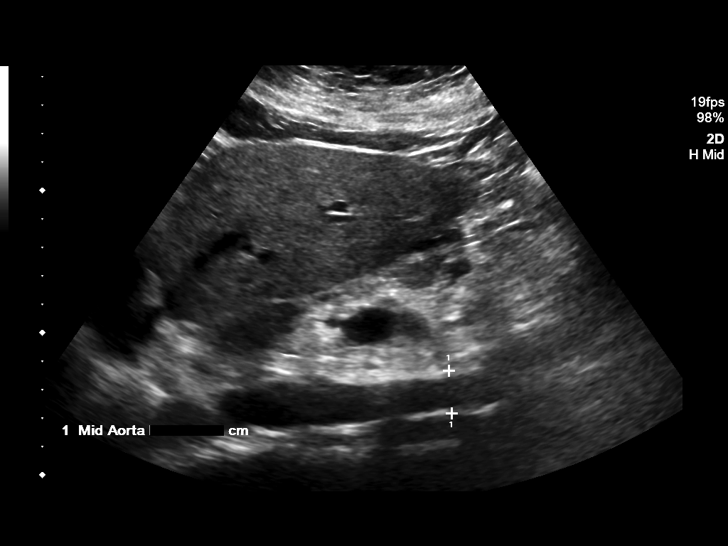
[im 79/79]
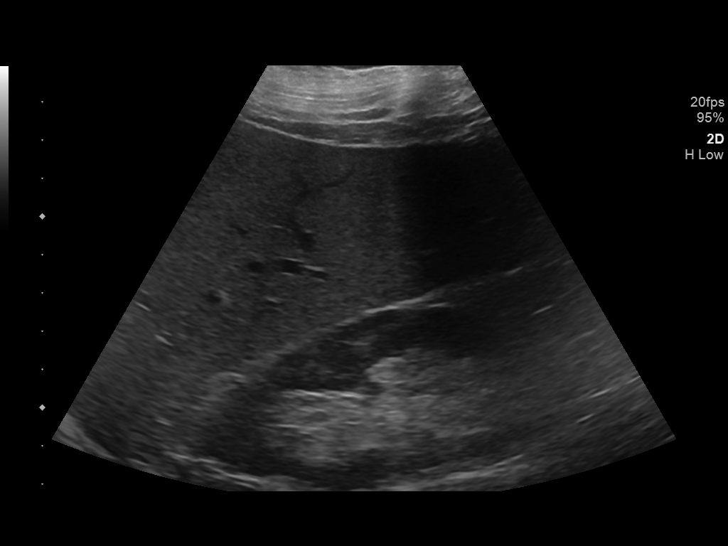

[14 of 25 positions shown; findings below may reference images not displayed]

FINDINGS: Gallbladder: Surgically absent.

Common bile duct: Diameter: 8 mm, normal for cholecystectomy state.
Normal tapering distally. No visualized choledocholithiasis.

Liver: No focal lesion identified. Diffusely increased in
parenchymal echogenicity. No capsular nodularity. Portal vein is
patent on color Doppler imaging with normal direction of blood flow
towards the liver.

IVC: No abnormality visualized.

Pancreas: Visualized portion unremarkable.

Spleen: Size and appearance within normal limits.

Right Kidney: Length: 10.0 cm. Echogenicity within normal limits. No
mass or hydronephrosis visualized.

Left Kidney: Length: 10.3 cm. Echogenicity within normal limits. No
mass or hydronephrosis visualized.

Abdominal aorta: No aneurysm visualized.

Other findings: No free fluid or ascites.
IMPRESSION: 1. Increased hepatic parenchymal echogenicity consistent with
steatosis or other intrinsic hepatocellular disease.
2. Post cholecystectomy. Common bile duct diameter of 8 mm is normal
for cholecystectomy state.

## 2021-12-23 ENCOUNTER — Encounter: Payer: Medicare PPO | Admitting: Podiatry

## 2022-01-03 ENCOUNTER — Other Ambulatory Visit: Payer: Self-pay | Admitting: Podiatry

## 2022-01-03 DIAGNOSIS — G8918 Other acute postprocedural pain: Secondary | ICD-10-CM | POA: Diagnosis not present

## 2022-01-03 DIAGNOSIS — M7741 Metatarsalgia, right foot: Secondary | ICD-10-CM | POA: Diagnosis not present

## 2022-01-03 DIAGNOSIS — M2011 Hallux valgus (acquired), right foot: Secondary | ICD-10-CM | POA: Diagnosis not present

## 2022-01-03 DIAGNOSIS — M2041 Other hammer toe(s) (acquired), right foot: Secondary | ICD-10-CM | POA: Diagnosis not present

## 2022-01-03 DIAGNOSIS — M205X1 Other deformities of toe(s) (acquired), right foot: Secondary | ICD-10-CM | POA: Diagnosis not present

## 2022-01-03 MED ORDER — ACETAMINOPHEN 500 MG PO TABS: 1000.0000 mg | ORAL_TABLET | Freq: Four times a day (QID) | ORAL | 0 refills | Status: AC | PRN

## 2022-01-03 MED ORDER — OXYCODONE HCL 5 MG PO TABS: 5.0000 mg | ORAL_TABLET | ORAL | 0 refills | Status: AC | PRN

## 2022-01-03 NOTE — Progress Notes (Signed)
01/03/22 R foot Akin, hammertoe 2/3 Weil 2

## 2022-01-06 ENCOUNTER — Encounter: Payer: Medicare PPO | Admitting: Podiatry

## 2022-01-10 ENCOUNTER — Ambulatory Visit (INDEPENDENT_AMBULATORY_CARE_PROVIDER_SITE_OTHER): Payer: Medicare PPO

## 2022-01-10 ENCOUNTER — Ambulatory Visit (INDEPENDENT_AMBULATORY_CARE_PROVIDER_SITE_OTHER): Payer: Medicare PPO | Admitting: Podiatry

## 2022-01-10 DIAGNOSIS — M2041 Other hammer toe(s) (acquired), right foot: Secondary | ICD-10-CM

## 2022-01-10 DIAGNOSIS — M21961 Unspecified acquired deformity of right lower leg: Secondary | ICD-10-CM

## 2022-01-10 DIAGNOSIS — M2011 Hallux valgus (acquired), right foot: Secondary | ICD-10-CM

## 2022-01-11 NOTE — Progress Notes (Signed)
  Subjective:  Patient ID: Cynthia Werner, female    DOB: 1945-09-08,  MRN: [id number]  Chief Complaint  Patient presents with   Routine Post Op    POV #1 DOS 01/03/2022 RT FOOT GREAT TEO STRAIGHTENING, 2ND & 3RD TOE CORRECTION, BONE CUT 2ND METATARSAL  Patient reports 10 out of 10 pain today. Only taking Rx pain medication at night because she's afraid she will run out.     76 y.o. female returns for post-op check.  She has not remove the boot at all.  Dressing has stayed on  Review of Systems: Negative except as noted in the HPI. Denies N/V/F/Ch.   Objective:  There were no vitals filed for this visit. There is no height or weight on file to calculate BMI. Constitutional Well developed. Well nourished.  Vascular Foot warm and well perfused. Capillary refill normal to all digits.  Calf is soft and supple, no posterior calf or knee pain, negative Homans' sign  Neurologic Normal speech. Oriented to person, place, and time. Epicritic sensation to light touch grossly present bilaterally.  Dermatologic Skin healing well without signs of infection. Skin edges well coapted without signs of infection.  Orthopedic: Tenderness to palpation noted about the surgical site.  Mild edema and ecchymosis   Multiple view plain film radiographs: Status post second third hammertoe correction, second Weil osteotomy, Akin osteotomy.  Some distal translation of the great toe and second toe Kirschner wires Assessment:   1. Hammertoe of second toe of right foot   2. Acquired hallux interphalangeus, right   3. Deformity of metatarsal bone of right foot    Plan:  Patient was evaluated and treated and all questions answered.  S/p foot surgery right -Progressing as expected post-operatively.  There may be some translation of the Kirschner wire but does not appear to have led to major loss of correction -XR: Noted above -WB Status: WBAT in CAM boot.  She may remove the boot when resting and in bed and  expect this will help some with her pain -Sutures: Plan to remove in 2 weeks. -Medications: No refills required.  I advised her during the day she can take another of the pain medication pills, I understand that she is hesitant to take these consistently but I also do not want her and so much pain that she is not able to rest.  Hopefully with dressing change and boot removal today she will be in less pain.  Continue ice and elevation -Foot redressed.  Return in about 2 weeks (around 01/24/2022) for post op (no x-rays), suture removal.

## 2022-01-18 DIAGNOSIS — Z961 Presence of intraocular lens: Secondary | ICD-10-CM | POA: Diagnosis not present

## 2022-01-18 DIAGNOSIS — H04123 Dry eye syndrome of bilateral lacrimal glands: Secondary | ICD-10-CM | POA: Diagnosis not present

## 2022-01-18 DIAGNOSIS — H524 Presbyopia: Secondary | ICD-10-CM | POA: Diagnosis not present

## 2022-01-24 ENCOUNTER — Ambulatory Visit (INDEPENDENT_AMBULATORY_CARE_PROVIDER_SITE_OTHER): Payer: Medicare PPO | Admitting: Podiatry

## 2022-01-24 DIAGNOSIS — M2011 Hallux valgus (acquired), right foot: Secondary | ICD-10-CM

## 2022-01-24 DIAGNOSIS — M2041 Other hammer toe(s) (acquired), right foot: Secondary | ICD-10-CM

## 2022-01-24 MED ORDER — HYDROCODONE-ACETAMINOPHEN 5-325 MG PO TABS: 2.0000 | ORAL_TABLET | ORAL | 0 refills | Status: AC | PRN

## 2022-01-24 NOTE — Progress Notes (Signed)
  Subjective:  Patient ID: Cynthia Werner, female    DOB: 22-Sep-1945,  MRN: [id number]  Chief Complaint  Patient presents with   Routine Post Op    POV #2 DOS 01/03/2022 RT FOOT GREAT TEO STRAIGHTENING, 2ND & 3RD TOE CORRECTION, BONE CUT 2ND METATARSAL- Sutures and pins are intact. Patient is taking Tylenol but it is not helping and patient is not taking the Oxycodone '5mg'$  for the pain.      76 y.o. female returns for post-op check.  Still had quite a bit of pain  Review of Systems: Negative except as noted in the HPI. Denies N/V/F/Ch.   Objective:  There were no vitals filed for this visit. There is no height or weight on file to calculate BMI. Constitutional Well developed. Well nourished.  Vascular Foot warm and well perfused. Capillary refill normal to all digits.  Calf is soft and supple, no posterior calf or knee pain, negative Homans' sign  Neurologic Normal speech. Oriented to person, place, and time. Epicritic sensation to light touch grossly present bilaterally.  Dermatologic Visions are well-healed nonhypertrophic  Orthopedic: Tenderness to palpation noted about the surgical site.  Mild edema and ecchymosis   Multiple view plain film radiographs: Status post second third hammertoe correction, second Weil osteotomy, Akin osteotomy.  Some distal translation of the great toe and second toe Kirschner wires Assessment:   1. Acquired hallux interphalangeus, right   2. Hammertoe of second toe of right foot    Plan:  Patient was evaluated and treated and all questions answered.  S/p foot surgery right -Sutures removed uneventfully, she may be WBAT in the cam boot, may resume bathing.  Refill of pain medication sent to pharmacy, switch to hydrocodone to see if this is more effective for her.  I will see her back in 4 weeks for new x-rays and removal of the pins  Return in about 4 weeks (around 02/21/2022) for post op (new x-rays).

## 2022-01-25 ENCOUNTER — Encounter: Payer: Medicare PPO | Admitting: Podiatry

## 2022-02-15 ENCOUNTER — Encounter: Payer: Medicare PPO | Admitting: Podiatry

## 2022-02-22 ENCOUNTER — Ambulatory Visit (INDEPENDENT_AMBULATORY_CARE_PROVIDER_SITE_OTHER): Payer: Medicare PPO

## 2022-02-22 ENCOUNTER — Ambulatory Visit (INDEPENDENT_AMBULATORY_CARE_PROVIDER_SITE_OTHER): Payer: Medicare PPO | Admitting: Podiatry

## 2022-02-22 DIAGNOSIS — T81509A Unspecified complication of foreign body accidentally left in body following unspecified procedure, initial encounter: Secondary | ICD-10-CM

## 2022-02-22 DIAGNOSIS — M21961 Unspecified acquired deformity of right lower leg: Secondary | ICD-10-CM

## 2022-02-22 DIAGNOSIS — M2011 Hallux valgus (acquired), right foot: Secondary | ICD-10-CM | POA: Diagnosis not present

## 2022-02-22 DIAGNOSIS — M2041 Other hammer toe(s) (acquired), right foot: Secondary | ICD-10-CM | POA: Diagnosis not present

## 2022-02-24 NOTE — Progress Notes (Signed)
  Subjective:  Patient ID: Cynthia Werner, female    DOB: Oct 07, 1945,  MRN: [id number]  Chief Complaint  Patient presents with   Routine Post Op    Patient states since the last visit foot is better , patient states foot is still sore with some swelling. Patient states she is concerned because she can tell that the pin is starting to come out of her 3rd digit      76 y.o. female returns for post-op check.  Pain is improved quite a bit  Review of Systems: Negative except as noted in the HPI. Denies N/V/F/Ch.   Objective:  There were no vitals filed for this visit. There is no height or weight on file to calculate BMI. Constitutional Well developed. Well nourished.  Vascular Foot warm and well perfused. Capillary refill normal to all digits.  Calf is soft and supple, no posterior calf or knee pain, negative Homans' sign  Neurologic Normal speech. Oriented to person, place, and time. Epicritic sensation to light touch grossly present bilaterally.  Dermatologic Incisions are well-healed nonhypertrophic  Orthopedic: She has minimal tenderness to palpation noted about the surgical site.  Edema improved   Multiple view plain film radiographs: Good consolidation across toe fusion sites and Weil osteotomy, delayed bone healing across the Akin osteotomy.  The third toe Kirschner wire has translated distally Assessment:   1. Acquired hallux interphalangeus, right   2. Hammertoe of second toe of right foot   3. Deformity of metatarsal bone of right foot    Plan:  Patient was evaluated and treated and all questions answered.  S/p foot surgery right -Kirschner wires were removed uneventfully.  Some delayed healing in the [facility] osteotomy site I do recommend she continue WBAT in protected weightbearing with a surgical shoe this was dispensed today.  I will see her back in 4 weeks for follow-up with new x-rays  Return in about 4 weeks (around 03/22/2022) for post op (new x-rays).

## 2022-03-22 ENCOUNTER — Encounter: Payer: Self-pay | Admitting: Podiatry

## 2022-03-22 ENCOUNTER — Ambulatory Visit (INDEPENDENT_AMBULATORY_CARE_PROVIDER_SITE_OTHER): Payer: Medicare PPO | Admitting: Podiatry

## 2022-03-22 ENCOUNTER — Ambulatory Visit (INDEPENDENT_AMBULATORY_CARE_PROVIDER_SITE_OTHER): Payer: Medicare PPO

## 2022-03-22 DIAGNOSIS — M21961 Unspecified acquired deformity of right lower leg: Secondary | ICD-10-CM

## 2022-03-22 DIAGNOSIS — M21611 Bunion of right foot: Secondary | ICD-10-CM

## 2022-03-22 DIAGNOSIS — M2011 Hallux valgus (acquired), right foot: Secondary | ICD-10-CM

## 2022-03-22 DIAGNOSIS — M2041 Other hammer toe(s) (acquired), right foot: Secondary | ICD-10-CM | POA: Diagnosis not present

## 2022-03-22 DIAGNOSIS — Z9889 Other specified postprocedural states: Secondary | ICD-10-CM

## 2022-03-23 NOTE — Progress Notes (Signed)
  Subjective:  Patient ID: Cynthia Werner, female    DOB: 1946/01/12,  MRN: [id number]  Chief Complaint  Patient presents with   Routine Post Op    DOS 01/03/2022 RT FOOT GREAT TEO STRAIGHTENING, 2ND & 3RD TOE CORRECTION, BONE CUT 2ND METATARSAL    "Its doing pretty good, I brought a shoe today"     76 y.o. female returns for post-op check.  She is doing well the toes are still sensitive but are improving  Review of Systems: Negative except as noted in the HPI. Denies N/V/F/Ch.   Objective:  There were no vitals filed for this visit. There is no height or weight on file to calculate BMI. Constitutional Well developed. Well nourished.  Vascular Foot warm and well perfused. Capillary refill normal to all digits.  Calf is soft and supple, no posterior calf or knee pain, negative Homans' sign  Neurologic Normal speech. Oriented to person, place, and time. Epicritic sensation to light touch grossly present bilaterally.  Dermatologic Incisions are well-healed nonhypertrophic  Orthopedic: She has minimal tenderness to palpation noted about the surgical site.  Edema has essentially resolved   Multiple view plain film radiographs: Recent bone callus formation across osteotomy and fusion sites, no change in alignment or hardware positioning Assessment:   1. Acquired hallux interphalangeus, right   2. Hammertoe of second toe of right foot   3. Deformity of metatarsal bone of right foot   4. Hallux valgus with bunions, right   5. Status post right foot surgery    Plan:  Patient was evaluated and treated and all questions answered.  S/p foot surgery right -Overall doing well and improving.  She may transition back to a supportive shoe such as her [employer] shoes.  She does have custom molded orthoses that support her well in the shell but the top-cover is dilapidated.  I recommended refurbishment of this.  These were capped and will be sent back to the lab for a new Top-cover.  I will see  her back in 3 months for surgery follow-up and new x-rays.  Return in about 3 months (around 06/22/2022) for surgery follow up right foot .

## 2022-04-13 ENCOUNTER — Telehealth: Payer: Self-pay | Admitting: Podiatry

## 2022-04-13 NOTE — Telephone Encounter (Signed)
Patient called checking on the status of her refurbished orthotics.    Called patient back and let her know they have not come back in yet , they were sent out on 10/24 explained that it could take 4weeks. I will call her when orthotics come in .

## 2022-04-14 DIAGNOSIS — M545 Low back pain, unspecified: Secondary | ICD-10-CM | POA: Diagnosis not present

## 2022-04-14 DIAGNOSIS — E7800 Pure hypercholesterolemia, unspecified: Secondary | ICD-10-CM | POA: Diagnosis not present

## 2022-04-14 DIAGNOSIS — K219 Gastro-esophageal reflux disease without esophagitis: Secondary | ICD-10-CM | POA: Diagnosis not present

## 2022-04-14 DIAGNOSIS — E039 Hypothyroidism, unspecified: Secondary | ICD-10-CM | POA: Diagnosis not present

## 2022-04-14 DIAGNOSIS — I10 Essential (primary) hypertension: Secondary | ICD-10-CM | POA: Diagnosis not present

## 2022-04-14 DIAGNOSIS — M8580 Other specified disorders of bone density and structure, unspecified site: Secondary | ICD-10-CM | POA: Diagnosis not present

## 2022-04-19 ENCOUNTER — Telehealth: Payer: Self-pay | Admitting: Podiatry

## 2022-04-19 NOTE — Telephone Encounter (Signed)
Left message on vm for pt to call back to schedule appt to pick up orthotics

## 2022-04-20 NOTE — Telephone Encounter (Signed)
Left message on vm to schedule appt for pickup of orthotics

## 2022-04-27 ENCOUNTER — Ambulatory Visit (INDEPENDENT_AMBULATORY_CARE_PROVIDER_SITE_OTHER): Payer: Medicare PPO

## 2022-04-27 DIAGNOSIS — M2011 Hallux valgus (acquired), right foot: Secondary | ICD-10-CM

## 2022-04-27 NOTE — Progress Notes (Signed)
Patient presents today to pick up custom molded foot orthotics, diagnosed with acquired hallux interphalangeus by Dr. Sherryle Lis.   Orthotics were dispensed and fit was satisfactory. Reviewed instructions for break-in and wear. Written instructions given to patient.  Patient will follow up as needed.   [facility] - order # [id number]

## 2022-06-23 ENCOUNTER — Ambulatory Visit (INDEPENDENT_AMBULATORY_CARE_PROVIDER_SITE_OTHER): Payer: Medicare PPO

## 2022-06-23 ENCOUNTER — Ambulatory Visit: Payer: Medicare PPO | Admitting: Podiatry

## 2022-06-23 DIAGNOSIS — M2011 Hallux valgus (acquired), right foot: Secondary | ICD-10-CM

## 2022-06-23 DIAGNOSIS — L600 Ingrowing nail: Secondary | ICD-10-CM | POA: Diagnosis not present

## 2022-06-27 NOTE — Progress Notes (Signed)
  Subjective:  Patient ID: Cynthia Werner, female    DOB: Mar 12, 1946,  MRN: [id number]  Chief Complaint  Patient presents with   Bunions    15mh for Surgery DOS 03/22/22 f/u on right foot     77y.o. female returns for post-op check.  She is doing well notes some tenderness in the fourth toenail, may have a stitch left  Review of Systems: Negative except as noted in the HPI. Denies N/V/F/Ch.   Objective:  There were no vitals filed for this visit. There is no height or weight on file to calculate BMI. Constitutional Well developed. Well nourished.  Vascular Foot warm and well perfused. Capillary refill normal to all digits.  Calf is soft and supple, no posterior calf or knee pain, negative Homans' sign  Neurologic Normal speech. Oriented to person, place, and time. Epicritic sensation to light touch grossly present bilaterally.  Dermatologic Incisions are well-healed nonhypertrophic, suture material left and third toe.  Pincer nail deformity of fourth toenail  Orthopedic: She has minimal tenderness to palpation noted about the surgical site.  Edema has essentially resolved   Multiple view plain film radiographs: Good consolidation across fusion sites, some delayed in the toes, position unchanged Assessment:   1. Acquired hallux interphalangeus, right    Plan:  Patient was evaluated and treated and all questions answered.  S/p foot surgery right -Overall doing well.  Continue regular shoe gear and activity.  I debrided the fourth toenail lateral border with a slant back fashion discussed partial matricectomy of this if it worsens.  Suture material was removed from the third toe.  She will follow-up me as needed at this point  No follow-ups on file.

## 2022-07-26 ENCOUNTER — Ambulatory Visit (INDEPENDENT_AMBULATORY_CARE_PROVIDER_SITE_OTHER): Payer: Medicare PPO

## 2022-07-26 ENCOUNTER — Ambulatory Visit: Payer: Medicare PPO | Admitting: Podiatry

## 2022-07-26 DIAGNOSIS — M2041 Other hammer toe(s) (acquired), right foot: Secondary | ICD-10-CM

## 2022-07-26 DIAGNOSIS — L600 Ingrowing nail: Secondary | ICD-10-CM

## 2022-07-26 DIAGNOSIS — M2011 Hallux valgus (acquired), right foot: Secondary | ICD-10-CM

## 2022-07-26 MED ORDER — NEOMYCIN-POLYMYXIN-HC 3.5-10000-1 OT SUSP: OTIC | 0 refills | Status: AC

## 2022-07-26 NOTE — Progress Notes (Signed)
  Subjective:  Patient ID: Cynthia Werner, female    DOB: 1946-04-28,  MRN: [id number]  Chief Complaint  Patient presents with   Hammer Toe    right surgical foot toes crunch when moving the toes    77 y.o. female presents with the above complaint. History confirmed with patient.  She returns for follow-up she felt something popping and a short-lived sharp pain in the toe.  The fourth toenail is ingrown still and causing problems  Objective:  Physical Exam: warm, good capillary refill, no trophic changes or ulcerative lesions, normal DP and PT pulses, and normal sensory exam. Left Foot: normal exam, no swelling, tenderness, instability; ligaments intact, full range of motion of all ankle/foot joints Right Foot:  Still some persistent motion at the PIPJ, very mild tenderness, fourth toe lateral border ingrown no paronychia  No images are attached to the encounter.  Radiographs: Multiple views x-ray of the right foot: Good healing across Akin osteotomy and Weil osteotomy site, the PIPJ of the second and third toes does show some delayed union Assessment:   1. Hammertoe of second toe of right foot   2. Acquired hallux interphalangeus, right   3. Ingrowing nail      Plan:  Patient was evaluated and treated and all questions answered.  We reviewed her radiographs and that we discussed that there is delayed union of the toe arthrodesis sites.  Discussed symptomology of this.  May still continue to have some fusion long-term.  It does really bother her from a functional or activity limiting standpoint currently.  If it becomes worse we will consider revision.  I still have no restrictions as far as activity or shoe gear as tolerated    Ingrown Nail, right -Patient elects to proceed with minor surgery to remove ingrown toenail today. Consent reviewed and signed by patient. -Ingrown nail excised. See procedure note. -Educated on post-procedure care including soaking. Written  instructions provided and reviewed. -Rx for Cortisporin sent to pharmacy. -Advised on signs and symptoms of infection developing.  We discussed that the phenol likely will create some redness and edema and tenderness around the nailbed as long as it is localized this is to be expected.  Will return as needed if any infection signs develop  Procedure: Excision of Ingrown Toenail Location: Right 4th toe lateral nail borders. Anesthesia: Lidocaine 1% plain; 1.5 mL and Marcaine 0.5% plain; 1.5 mL, digital block. Skin Prep: Betadine. Dressing: Silvadene; telfa; dry, sterile, compression dressing. Technique: Following skin prep, the toe was exsanguinated and a tourniquet was secured at the base of the toe. The affected nail border was freed, split with a nail splitter, and excised. Chemical matrixectomy was then performed with phenol and irrigated out with alcohol. The tourniquet was then removed and sterile dressing applied. Disposition: Patient tolerated procedure well.    No follow-ups on file.

## 2022-07-26 NOTE — Patient Instructions (Signed)

## 2022-08-02 ENCOUNTER — Encounter: Payer: Self-pay | Admitting: Orthopaedic Surgery

## 2022-08-02 ENCOUNTER — Ambulatory Visit: Payer: Medicare PPO | Admitting: Orthopaedic Surgery

## 2022-08-02 ENCOUNTER — Ambulatory Visit (INDEPENDENT_AMBULATORY_CARE_PROVIDER_SITE_OTHER): Payer: Medicare PPO

## 2022-08-02 VITALS — BP 140/77 | HR 43 | Ht 63.0 in | Wt 198.0 lb

## 2022-08-02 DIAGNOSIS — M545 Low back pain, unspecified: Secondary | ICD-10-CM

## 2022-08-02 MED ORDER — PREDNISONE 10 MG (21) PO TBPK: ORAL_TABLET | ORAL | 0 refills | Status: AC

## 2022-08-02 MED ORDER — OXYCODONE-ACETAMINOPHEN 5-325 MG PO TABS: 1.0000 | ORAL_TABLET | Freq: Four times a day (QID) | ORAL | 0 refills | Status: AC | PRN

## 2022-08-02 NOTE — Progress Notes (Signed)
Office Visit Note   Patient: Cynthia Werner           Date of Birth: 10/14/1945           MRN: [id number] Visit Date: 08/02/2022              Requested by: Cynthia Bender, PA-C [address] [place],  [place] [address] PCP: Cynthia Bender, PA-C   Assessment & Plan: Visit Diagnoses:  1. Acute right-sided low back pain, unspecified whether sciatica present     Plan: Reviewed x-rays comparison to 2013 images with progression of her lumbar scoliosis.  She likely had some disc bulge with acute onset of severe pain 3 days ago.  Will place her on a prednisone Dosepak.  Percocet 20 tablets prescribed for for severe pain.  Recheck 2 weeks.  She then persistent problems we will consider diagnostic imaging.  At this point I do not think therapy would be of any help and she is having too much pain and actually participate in therapy.  Follow-Up Instructions: Return in about 2 weeks (around 08/16/2022).   Orders:  Orders Placed This Encounter  Procedures   XR Lumbar Spine 2-3 Views   Meds ordered this encounter  Medications   predniSONE (STERAPRED UNI-PAK 21 TAB) 10 MG (21) TBPK tablet    Sig: Take 6,5,4,3,2,1 one tablet less each day. Take with food    Dispense:  21 tablet    Refill:  0   oxyCODONE-acetaminophen (PERCOCET/ROXICET) 5-325 MG tablet    Sig: Take 1-2 tablets by mouth every 6 (six) hours as needed for severe pain.    Dispense:  20 tablet    Refill:  0      Procedures: No procedures performed   Clinical Data: No additional findings.   Subjective: Chief Complaint  Patient presents with   Lower Back - Pain    HPI 77 year old female with lumbar disc degeneration and degenerative lumbar scoliosis had previous microdiscectomy by me L5-S1 2013.  2016 she had laminectomies at L4-5 L5-S1 at [facility] by Dr.Kavikari.  She states 3 days ago she has developed severe back pain right leg pain burning in her hip difficulty standing and she is having trouble walking burning in  her thigh.  She cannot get comfortable.  She has been on Lyrica chronically.  Review of Systems positive for obesity GERD IBS.  No bowel bladder symptoms associated symptoms currently no fever or chills.   Objective: Vital Signs: BP (!) 140/77   Pulse (!) 43   Ht '5\' 3"'$  (1.6 m)   Wt 198 lb (89.8 kg)   BMI 35.07 kg/m   Physical Exam Constitutional:      Appearance: She is well-developed.  HENT:     Head: Normocephalic.     Right Ear: External ear normal.     Left Ear: External ear normal. There is no impacted cerumen.  Eyes:     Pupils: Pupils are equal, round, and reactive to light.  Neck:     Thyroid: No thyromegaly.     Trachea: No tracheal deviation.  Cardiovascular:     Rate and Rhythm: Normal rate.  Pulmonary:     Effort: Pulmonary effort is normal.  Abdominal:     Palpations: Abdomen is soft.  Musculoskeletal:     Cervical back: No rigidity.  Skin:    General: Skin is warm and dry.  Neurological:     Mental Status: She is alert and oriented to person, place, and time.  Psychiatric:  Behavior: Behavior normal.     Ortho Exam patient has well-healed lumbar incision L4-S1.  She has sciatic notch tenderness more on the right than left some pain with straight leg raising at 90 degrees anterior tib EHL is active.  She cannot sit straight she has to lean to 1 side.  She ambulates with hips flexed.  Specialty Comments:  No specialty comments available.  Imaging: XR Lumbar Spine 2-3 Views  Result Date: 08/02/2022 AP lateral lumbar images are obtained and reviewed comparison to previous radiographs 2013.  Patient has progressive left degenerative lumbar curvature apex L3 measuring 30 degrees from L1-L5.  Previous decompression L4-5 L5-S1 laminectomy noted. Impression: Degenerative scoliosis post decompression as described above.    PMFS History: Patient Active Problem List   Diagnosis Date Noted   Post laminectomy syndrome 01/18/2018   Chronic pain syndrome  01/18/2018   HNP (herniated nucleus pulposus), lumbar 06/29/2011    Class: Diagnosis of   PERSONAL HX COLONIC POLYPS 02/18/2009   OBESITY 12/31/2008   GERD 12/31/2008   IBS 12/31/2008   Past Medical History:  Diagnosis Date   Arthritis    low back, HNP- L5-S1   Cataract    GERD (gastroesophageal reflux disease)    Hx of colonic polyps    Hyperlipidemia    Hypertension    stress test done 10+ yrs. ago,  done due to being on a diet pill, told that it was  wnl    Hypothyroidism    Shortness of breath     Family History  Problem Relation Age of Onset   Anesthesia problems Sister        father's sister didn't wake from anesthesia , pt.'s sister woke up slowly   Breast cancer Mother    Heart disease Father    Breast cancer Maternal Aunt        x2   Breast cancer Paternal Aunt    Hypotension Neg Hx    Malignant hyperthermia Neg Hx    Pseudochol deficiency Neg Hx    Colon cancer Neg Hx    Stomach cancer Neg Hx    Esophageal cancer Neg Hx    Pancreatic cancer Neg Hx    Rectal cancer Neg Hx     Past Surgical History:  Procedure Laterality Date   CHOLECYSTECTOMY     2003   [place] [facility]     2005- R foot, "nerve snipped "   FRACTURE SURGERY     L foot, bunionectomy followed by special surgery to further stabilize the foot    LUMBAR LAMINECTOMY  06/29/2011   x2;2nd sx at [facility] Procedure: MICRODISCECTOMY LUMBAR LAMINECTOMY;  Surgeon: Marybelle Killings, MD;  Location: [facility];  Service: Orthopedics;  Laterality: Right;  Right L5-S1 Microdiscectomy   TUBAL LIGATION     UPPER GASTROINTESTINAL ENDOSCOPY     Social History   Occupational History   Not on file  Tobacco Use   Smoking status: Never   Smokeless tobacco: Never  Vaping Use   Vaping Use: Never used  Substance and Sexual Activity   Alcohol use: No   Drug use: No   Sexual activity: Not on file

## 2022-08-16 ENCOUNTER — Ambulatory Visit: Payer: Medicare PPO | Admitting: Orthopaedic Surgery

## 2022-08-16 VITALS — Ht 63.0 in | Wt 198.0 lb

## 2022-08-16 DIAGNOSIS — M5126 Other intervertebral disc displacement, lumbar region: Secondary | ICD-10-CM | POA: Diagnosis not present

## 2022-08-16 NOTE — Progress Notes (Signed)
Office Visit Note   Patient: Cynthia Werner           Date of Birth: 1946-02-21           MRN: [id number] Visit Date: 08/16/2022              Requested by: Cyndi Bender, PA-C [address] [place],  [place] [address] PCP: Cyndi Bender, PA-C   Assessment & Plan: Visit Diagnoses:  1. HNP (herniated nucleus pulposus), lumbar     Plan: As we discussed with her last visit she likely had a disc bulge which is improved some with the prednisone.  Will see her back on an as-needed basis she is happy with the results of the prednisone.  She will notify us if she has any recurrent pain problems.  Currently she does not need an MRI scan.  Follow-Up Instructions: Return if symptoms worsen or fail to improve.   Orders:  No orders of the defined types were placed in this encounter.  No orders of the defined types were placed in this encounter.     Procedures: No procedures performed   Clinical Data: No additional findings.   Subjective: Chief Complaint  Patient presents with   Lower Back - Follow-up    HPI 77 year old female doing follow-up with acute lumbar pain.  She states after 08/02/2022 office visit when she got home she could not get out of the car could not stand and took family members to help assist to get her in to the house and into the bed.  She states after 2 days of the prednisone Dosepak initial doses she states she is felt back to normal with just some mild low back discomfort that she has had since the surgery she had at [facility] several years ago.  I originally did her first back surgery 2013 and L5-S1.  Review of Systems updated unchanged   Objective: Vital Signs: Ht 5\' 3"  (1.6 m)   Wt 198 lb (89.8 kg)   BMI 35.07 kg/m   Physical Exam Constitutional:      Appearance: She is well-developed.  HENT:     Head: Normocephalic.     Right Ear: External ear normal.     Left Ear: External ear normal. There is no impacted cerumen.  Eyes:     Pupils: Pupils  are equal, round, and reactive to light.  Neck:     Thyroid: No thyromegaly.     Trachea: No tracheal deviation.  Cardiovascular:     Rate and Rhythm: Normal rate.  Pulmonary:     Effort: Pulmonary effort is normal.  Abdominal:     Palpations: Abdomen is soft.  Musculoskeletal:     Cervical back: No rigidity.  Skin:    General: Skin is warm and dry.  Neurological:     Mental Status: She is alert and oriented to person, place, and time.  Psychiatric:        Behavior: Behavior normal.     Ortho Exam patient gets easily from sitting standing normal heel-toe gait she moves fairly rapidly from the chair to the upright position and can ambulate up and down the hall without problems.  Specialty Comments:  No specialty comments available.  Imaging: No results found.   PMFS History: Patient Active Problem List   Diagnosis Date Noted   Post laminectomy syndrome 01/18/2018   Chronic pain syndrome 01/18/2018   HNP (herniated nucleus pulposus), lumbar 06/29/2011    Class: Diagnosis of   PERSONAL HX  COLONIC POLYPS 02/18/2009   OBESITY 12/31/2008   GERD 12/31/2008   IBS 12/31/2008   Past Medical History:  Diagnosis Date   Arthritis    low back, HNP- L5-S1   Cataract    GERD (gastroesophageal reflux disease)    Hx of colonic polyps    Hyperlipidemia    Hypertension    stress test done 10+ yrs. ago,  done due to being on a diet pill, told that it was  wnl    Hypothyroidism    Shortness of breath     Family History  Problem Relation Age of Onset   Anesthesia problems Sister        father's sister didn't wake from anesthesia , pt.'s sister woke up slowly   Breast cancer Mother    Heart disease Father    Breast cancer Maternal Aunt        x2   Breast cancer Paternal Aunt    Hypotension Neg Hx    Malignant hyperthermia Neg Hx    Pseudochol deficiency Neg Hx    Colon cancer Neg Hx    Stomach cancer Neg Hx    Esophageal cancer Neg Hx    Pancreatic cancer Neg Hx     Rectal cancer Neg Hx     Past Surgical History:  Procedure Laterality Date   CHOLECYSTECTOMY     2003   [facility]     2005- R foot, "nerve snipped "   FRACTURE SURGERY     L foot, bunionectomy followed by special surgery to further stabilize the foot    LUMBAR LAMINECTOMY  06/29/2011   x2;2nd sx at [facility] Procedure: MICRODISCECTOMY LUMBAR LAMINECTOMY;  Surgeon: Marybelle Killings, MD;  Location: [facility];  Service: Orthopedics;  Laterality: Right;  Right L5-S1 Microdiscectomy   TUBAL LIGATION     UPPER GASTROINTESTINAL ENDOSCOPY     Social History   Occupational History   Not on file  Tobacco Use   Smoking status: Never   Smokeless tobacco: Never  Vaping Use   Vaping Use: Never used  Substance and Sexual Activity   Alcohol use: No   Drug use: No   Sexual activity: Not on file

## 2022-10-07 ENCOUNTER — Other Ambulatory Visit: Payer: Self-pay | Admitting: Physician Assistant

## 2022-10-07 DIAGNOSIS — Z1231 Encounter for screening mammogram for malignant neoplasm of breast: Secondary | ICD-10-CM

## 2022-10-18 DIAGNOSIS — E7800 Pure hypercholesterolemia, unspecified: Secondary | ICD-10-CM | POA: Diagnosis not present

## 2022-10-18 DIAGNOSIS — Z9181 History of falling: Secondary | ICD-10-CM | POA: Diagnosis not present

## 2022-10-18 DIAGNOSIS — E039 Hypothyroidism, unspecified: Secondary | ICD-10-CM | POA: Diagnosis not present

## 2022-10-18 DIAGNOSIS — I10 Essential (primary) hypertension: Secondary | ICD-10-CM | POA: Diagnosis not present

## 2022-10-18 DIAGNOSIS — K219 Gastro-esophageal reflux disease without esophagitis: Secondary | ICD-10-CM | POA: Diagnosis not present

## 2022-10-18 DIAGNOSIS — Z139 Encounter for screening, unspecified: Secondary | ICD-10-CM | POA: Diagnosis not present

## 2022-10-18 DIAGNOSIS — M8580 Other specified disorders of bone density and structure, unspecified site: Secondary | ICD-10-CM | POA: Diagnosis not present

## 2022-10-18 DIAGNOSIS — M545 Low back pain, unspecified: Secondary | ICD-10-CM | POA: Diagnosis not present

## 2022-10-18 DIAGNOSIS — Z1159 Encounter for screening for other viral diseases: Secondary | ICD-10-CM | POA: Diagnosis not present

## 2022-10-18 DIAGNOSIS — Z0000 Encounter for general adult medical examination without abnormal findings: Secondary | ICD-10-CM | POA: Diagnosis not present

## 2022-11-11 DIAGNOSIS — L821 Other seborrheic keratosis: Secondary | ICD-10-CM | POA: Diagnosis not present

## 2022-11-11 DIAGNOSIS — L814 Other melanin hyperpigmentation: Secondary | ICD-10-CM | POA: Diagnosis not present

## 2022-11-11 DIAGNOSIS — D229 Melanocytic nevi, unspecified: Secondary | ICD-10-CM | POA: Diagnosis not present

## 2022-11-16 ENCOUNTER — Ambulatory Visit
Admission: RE | Admit: 2022-11-16 | Discharge: 2022-11-16 | Disposition: A | Discharge status: Home / Self Care | Payer: Medicare PPO | Source: Ambulatory Visit | Attending: Physician Assistant | Admitting: Physician Assistant

## 2022-11-16 DIAGNOSIS — Z1231 Encounter for screening mammogram for malignant neoplasm of breast: Secondary | ICD-10-CM

## 2023-04-03 DIAGNOSIS — H524 Presbyopia: Secondary | ICD-10-CM | POA: Diagnosis not present

## 2023-04-03 DIAGNOSIS — Z961 Presence of intraocular lens: Secondary | ICD-10-CM | POA: Diagnosis not present

## 2023-04-03 DIAGNOSIS — H04123 Dry eye syndrome of bilateral lacrimal glands: Secondary | ICD-10-CM | POA: Diagnosis not present

## 2023-04-08 IMAGING — MG MM DIGITAL SCREENING BILAT W/ TOMO AND CAD
8 series · 8 of 24 positions shown · non-contrast
Comparison: Previous exam(s).

CLINICAL DATA: Screening.

EXAM:
DIGITAL SCREENING BILATERAL MAMMOGRAM WITH TOMOSYNTHESIS AND CAD
TECHNIQUE: Bilateral screening digital craniocaudal and mediolateral oblique
mammograms were obtained. Bilateral screening digital breast
tomosynthesis was performed. The images were evaluated with
computer-aided detection.

[L CC synth-2D]
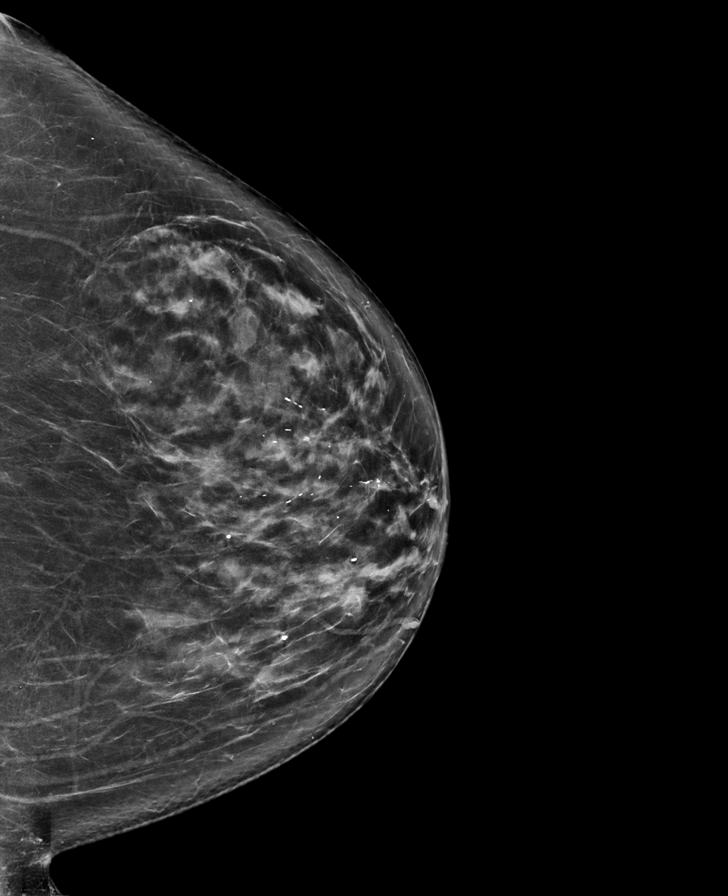

[R CC synth-2D]
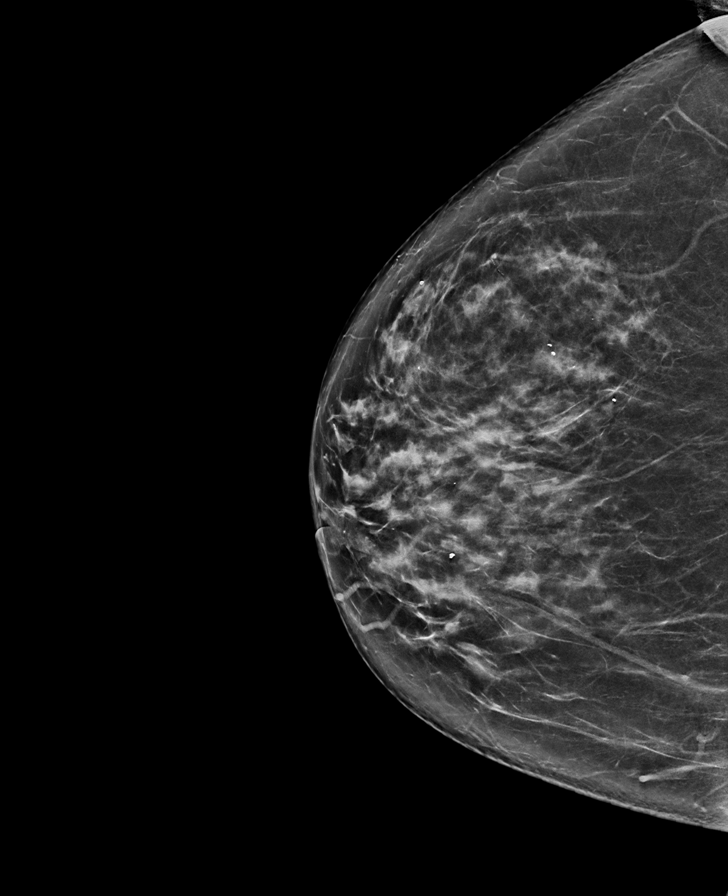

[L MLO synth-2D]
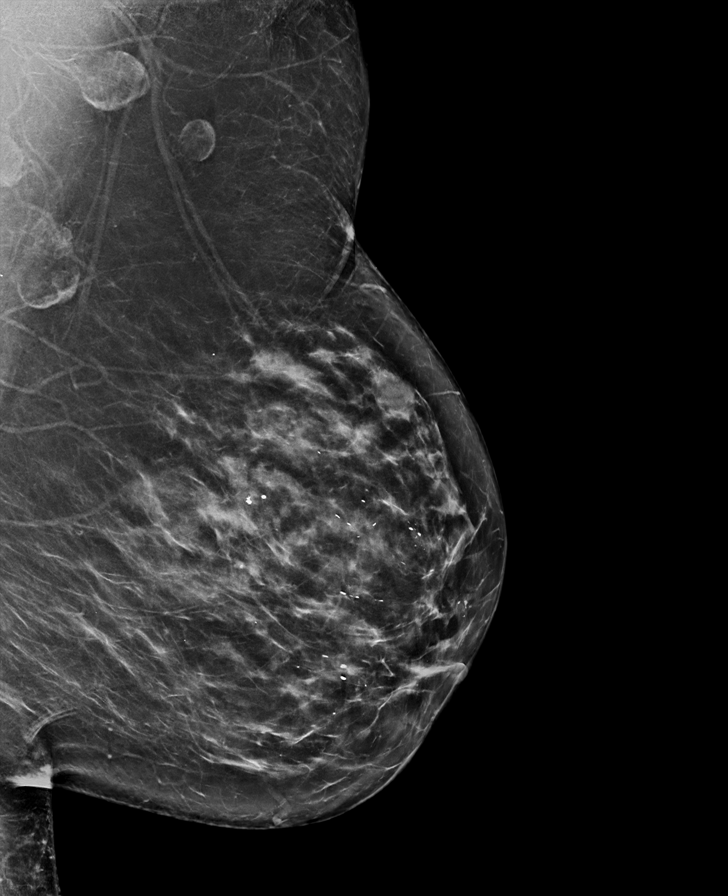

[R MLO synth-2D]
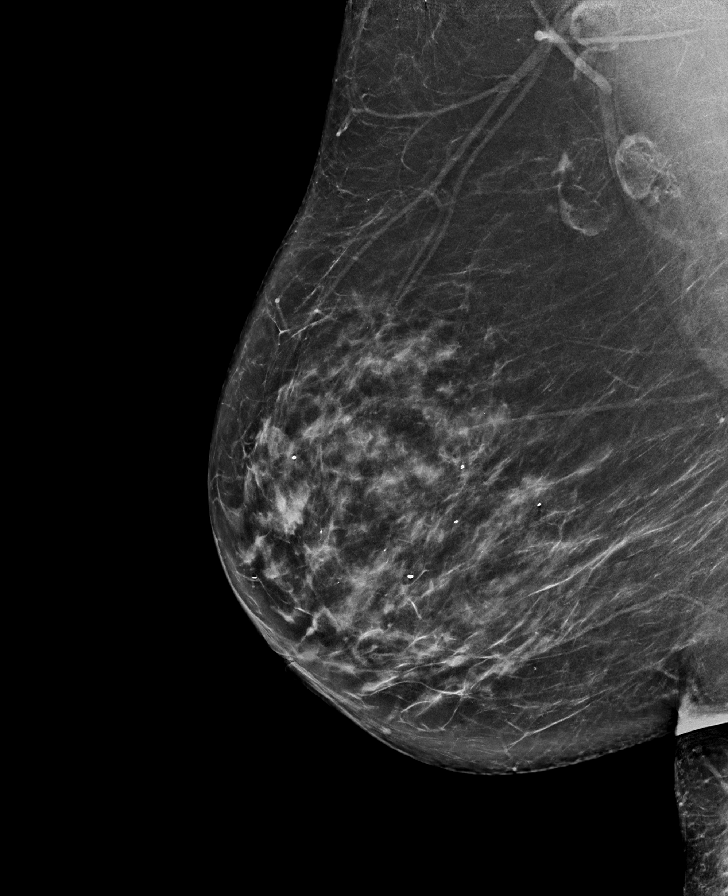

[R MLO tomo · tomo slice 45/88.0]
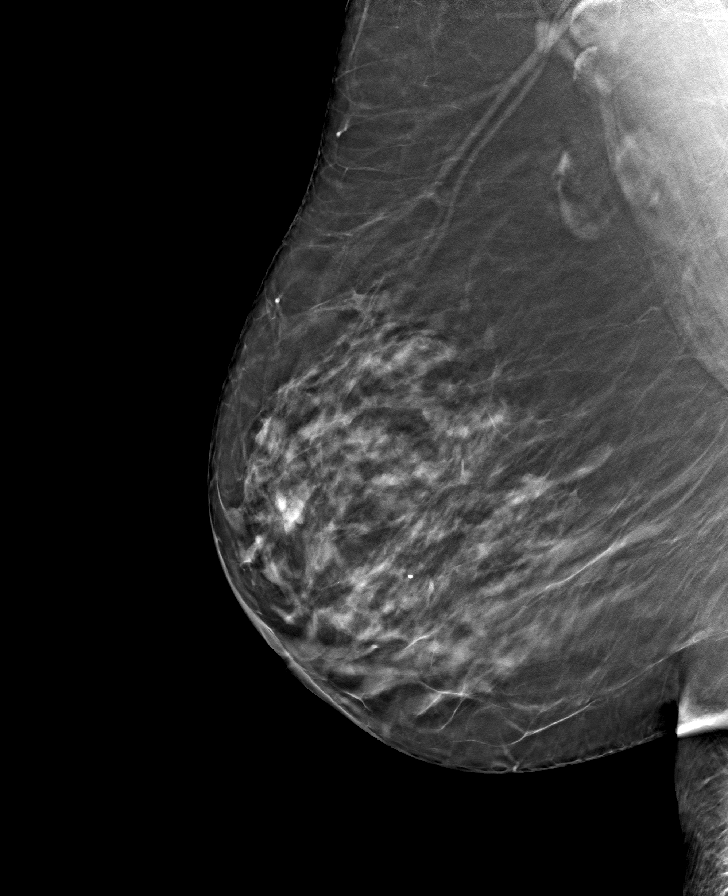

[R CC tomo · tomo slice 42/83.0]
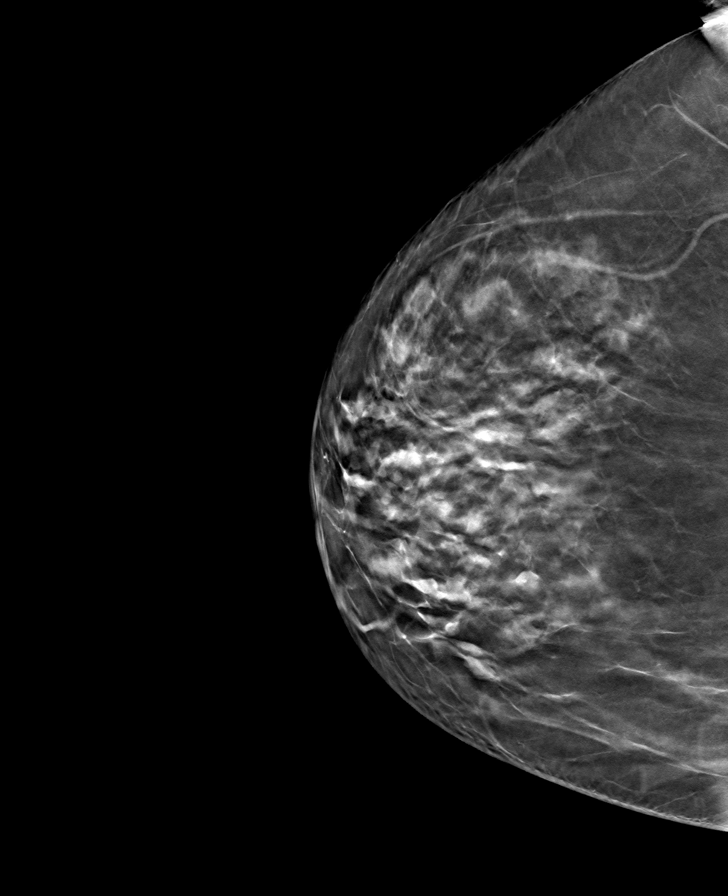

[L CC tomo · tomo slice 45/88.0]
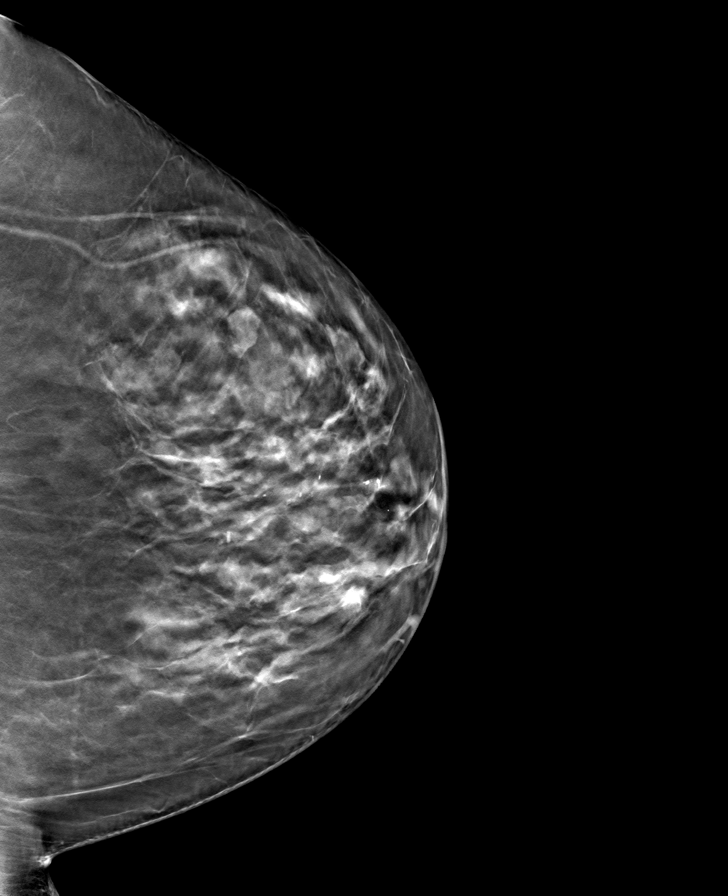

[L MLO tomo · tomo slice 45/88.0]
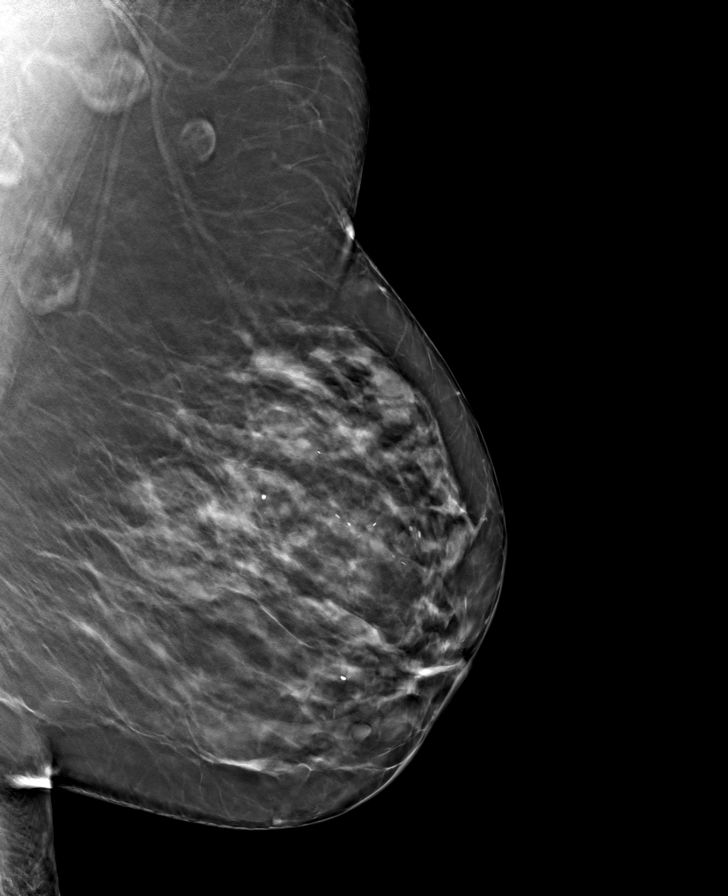

[8 of 24 positions shown; findings below may reference images not displayed]

ACR Breast Density Category c: The breast tissue is heterogeneously
dense, which may obscure small masses.
FINDINGS: There are no findings suspicious for malignancy.
IMPRESSION: No mammographic evidence of malignancy. A result letter of this
screening mammogram will be mailed directly to the patient.

RECOMMENDATION:
Screening mammogram in one year. (Code:Q3-W-BC3)

BI-RADS CATEGORY  1: Negative.

## 2023-07-07 DIAGNOSIS — E039 Hypothyroidism, unspecified: Secondary | ICD-10-CM | POA: Diagnosis not present

## 2023-07-07 DIAGNOSIS — M8580 Other specified disorders of bone density and structure, unspecified site: Secondary | ICD-10-CM | POA: Diagnosis not present

## 2023-07-07 DIAGNOSIS — M545 Low back pain, unspecified: Secondary | ICD-10-CM | POA: Diagnosis not present

## 2023-07-07 DIAGNOSIS — Z79899 Other long term (current) drug therapy: Secondary | ICD-10-CM | POA: Diagnosis not present

## 2023-07-07 DIAGNOSIS — I10 Essential (primary) hypertension: Secondary | ICD-10-CM | POA: Diagnosis not present

## 2023-07-07 DIAGNOSIS — Z23 Encounter for immunization: Secondary | ICD-10-CM | POA: Diagnosis not present

## 2023-07-07 DIAGNOSIS — E7800 Pure hypercholesterolemia, unspecified: Secondary | ICD-10-CM | POA: Diagnosis not present

## 2023-07-07 DIAGNOSIS — K219 Gastro-esophageal reflux disease without esophagitis: Secondary | ICD-10-CM | POA: Diagnosis not present

## 2023-07-11 DIAGNOSIS — Z79899 Other long term (current) drug therapy: Secondary | ICD-10-CM | POA: Diagnosis not present

## 2023-07-11 DIAGNOSIS — E039 Hypothyroidism, unspecified: Secondary | ICD-10-CM | POA: Diagnosis not present

## 2023-07-11 DIAGNOSIS — I10 Essential (primary) hypertension: Secondary | ICD-10-CM | POA: Diagnosis not present

## 2023-07-11 DIAGNOSIS — E7800 Pure hypercholesterolemia, unspecified: Secondary | ICD-10-CM | POA: Diagnosis not present

## 2023-08-29 DIAGNOSIS — R7303 Prediabetes: Secondary | ICD-10-CM | POA: Diagnosis not present

## 2023-08-29 DIAGNOSIS — E538 Deficiency of other specified B group vitamins: Secondary | ICD-10-CM | POA: Diagnosis not present

## 2023-10-11 ENCOUNTER — Encounter: Payer: Self-pay | Admitting: Physician Assistant

## 2023-10-11 ENCOUNTER — Other Ambulatory Visit (INDEPENDENT_AMBULATORY_CARE_PROVIDER_SITE_OTHER): Payer: Self-pay

## 2023-10-11 ENCOUNTER — Ambulatory Visit: Admitting: Physician Assistant

## 2023-10-11 DIAGNOSIS — G8929 Other chronic pain: Secondary | ICD-10-CM

## 2023-10-11 DIAGNOSIS — M25511 Pain in right shoulder: Secondary | ICD-10-CM | POA: Diagnosis not present

## 2023-10-11 MED ORDER — MELOXICAM 7.5 MG PO TABS: 7.5000 mg | ORAL_TABLET | Freq: Every day | ORAL | 0 refills | Status: AC

## 2023-10-11 MED ORDER — METHYLPREDNISOLONE ACETATE 40 MG/ML IJ SUSP
40.0000 mg | INTRAMUSCULAR | Status: AC | PRN
  Administered 2023-10-11: 40 mg via INTRA_ARTICULAR

## 2023-10-11 MED ORDER — LIDOCAINE HCL 1 % IJ SOLN
3.0000 mL | INTRAMUSCULAR | Status: AC | PRN
  Administered 2023-10-11: 3 mL

## 2023-10-11 NOTE — Addendum Note (Signed)
 Addended by: Freda Jacobson on: 10/11/2023 09:46 AM   Modules accepted: Orders

## 2023-10-11 NOTE — Progress Notes (Signed)
 Office Visit Note   Patient: Cynthia Werner           Date of Birth: 04-Feb-1946           MRN: [id number] Visit Date: 10/11/2023              Requested by: Aloha Arnold, PA-C [address] [place],  [place] [address] PCP: Aloha Arnold, PA-C   Assessment & Plan: Visit Diagnoses:  1. Chronic right shoulder pain     Plan: Pleasant 78 year old woman previous patient of Dr. Murrel Arnt.  Comes in today with a 1 to 81-month history of right shoulder pain.  She denies any particular injuries.  She did have some problems with her shoulder and neck in the past and saw Royston Cornea.  She has not been seen since then.  I do not think her neck is a big component today I think she has findings consistent with rotator cuff tendinitis perhaps a little bit of adhesive capsulitis.  I recommended a steroid injection she is willing to go through that today.  Also will refer her to physical therapy I would like to see her back in 3 weeks to see if the injection was helpful  Follow-Up Instructions: Return in about 3 weeks (around 11/01/2023).   Orders:  Orders Placed This Encounter  Procedures  . XR Shoulder Right   No orders of the defined types were placed in this encounter.     Procedures: Large Joint Inj: R subacromial bursa on 10/11/2023 9:42 AM Indications: diagnostic evaluation and pain Details: 25 G 1.5 in needle, posterior approach  Arthrogram: No  Medications: 3 mL lidocaine  1 %; 40 mg methylPREDNISolone  acetate 40 MG/ML Outcome: tolerated well, no immediate complications Procedure, treatment alternatives, risks and benefits explained, specific risks discussed. Consent was given by the patient.     Clinical Data: No additional findings.   Subjective: No chief complaint on file.   HPI patient is a 78 year old woman who seen Dr. Murrel Arnt in the past for neck and right shoulder pain.  She comes in today complaining of right shoulder pain describes it as jabbing when she picks things up.   She has limited range of motion and this radiates to the neck.  She has been going on for 1 to 2 months no particular injury in the past has had a steroid Dosepak which has helped her  Review of Systems  All other systems reviewed and are negative.    Objective: Vital Signs: There were no vitals taken for this visit.  Physical Exam Constitutional:      Appearance: Normal appearance.  Pulmonary:     Effort: Pulmonary effort is normal.  Skin:    General: Skin is warm and dry.  Neurological:     General: No focal deficit present.     Mental Status: She is alert.    Ortho Exam Examination of her right shoulder she is neurovascular intact she has pain with forward elevation with a little tightness.  Significant pain with internal rotation behind the back.  She has good biceps triceps strength abductor strength limited only by pain not as much pain with external rotation she has significant pain with empty can testing some pain with speeds testing.  She does have good grip strength no paresthesias Specialty Comments:  No specialty comments available.  Imaging: XR Shoulder Right Result Date: 10/11/2023 Radiographs are right showing well-maintained alignment no significant degenerative changes very similar to those x-rays of 4 years ago  PMFS History: Patient Active Problem List   Diagnosis Date Noted  . Post laminectomy syndrome 01/18/2018  . Chronic pain syndrome 01/18/2018  . HNP (herniated nucleus pulposus), lumbar 06/29/2011    Class: Diagnosis of  . History of colonic polyps 02/18/2009  . OBESITY 12/31/2008  . GERD 12/31/2008  . IBS 12/31/2008   Past Medical History:  Diagnosis Date  . Arthritis    low back, HNP- L5-S1  . Cataract   . GERD (gastroesophageal reflux disease)   . Hx of colonic polyps   . Hyperlipidemia   . Hypertension    stress test done 10+ yrs. ago,  done due to being on a diet pill, told that it was  wnl   . Hypothyroidism   . Shortness of  breath     Family History  Problem Relation Age of Onset  . Anesthesia problems Sister        father's sister didn't wake from anesthesia , pt.'s sister woke up slowly  . Breast cancer Mother   . Heart disease Father   . Breast cancer Maternal Aunt        x2  . Breast cancer Paternal Aunt   . Hypotension Neg Hx   . Malignant hyperthermia Neg Hx   . Pseudochol deficiency Neg Hx   . Colon cancer Neg Hx   . Stomach cancer Neg Hx   . Esophageal cancer Neg Hx   . Pancreatic cancer Neg Hx   . Rectal cancer Neg Hx     Past Surgical History:  Procedure Laterality Date  . CHOLECYSTECTOMY     2003  . COLONOSCOPY    . FOOT NEUROMA SURGERY     2005- R foot, "nerve snipped "  . FRACTURE SURGERY     L foot, bunionectomy followed by special surgery to further stabilize the foot   . LUMBAR LAMINECTOMY  06/29/2011   x2;2nd sx at [facility] Procedure: MICRODISCECTOMY LUMBAR LAMINECTOMY;  Surgeon: Adah Acron, MD;  [facility];  Service: Orthopedics;  Laterality: Right;  Right L5-S1 Microdiscectomy  . TUBAL LIGATION    . UPPER GASTROINTESTINAL ENDOSCOPY     Social History   Occupational History  . Not on file  Tobacco Use  . Smoking status: Never  . Smokeless tobacco: Never  Vaping Use  . Vaping status: Never Used  Substance and Sexual Activity  . Alcohol use: No  . Drug use: No  . Sexual activity: Not on file

## 2023-11-02 ENCOUNTER — Ambulatory Visit: Admitting: Physician Assistant

## 2023-11-02 ENCOUNTER — Encounter: Payer: Self-pay | Admitting: Physician Assistant

## 2023-11-02 DIAGNOSIS — G8929 Other chronic pain: Secondary | ICD-10-CM | POA: Diagnosis not present

## 2023-11-02 DIAGNOSIS — M25511 Pain in right shoulder: Secondary | ICD-10-CM | POA: Diagnosis not present

## 2023-11-02 NOTE — Progress Notes (Signed)
 Office Visit Note   Patient: Cynthia Werner           Date of Birth: [birth year]           MRN: [id number] Visit Date: 11/02/2023              Requested by: Aloha Arnold, PA-C [address] [place],  [place] [address] PCP: Aloha Arnold, PA-C  Right shoulder pain    HPI: Kanai is a pleasant 78 year old woman who is very active with [employer].  Seeing here for follow-up on right shoulder pain rotator cuff tendinitis she has not started physical therapy but reports the shoulder is much better after the injection just has occasional clicking  Assessment & Plan: Visit Diagnoses:  1. Chronic right shoulder pain     Plan: I still think she would benefit from physical therapy will place an order today may follow-up as needed  Follow-Up Instructions: No follow-ups on file.   Ortho Exam  Patient is alert, oriented, no adenopathy, well-dressed, normal affect, normal respiratory effort. Right shoulder she has full forward elevation she has internal rotation to the mid back without pain she has good strength with resistance abductor external and internal rotation    Imaging: No results found. No images are attached to the encounter.  Labs: No results found for: "HGBA1C", "ESRSEDRATE", "CRP", "LABURIC", "REPTSTATUS", "GRAMSTAIN", "CULT", "LABORGA"   Lab Results  Component Value Date   ALBUMIN 3.9 07/16/2020    No results found for: "MG" No results found for: "VD25OH"  No results found for: "PREALBUMIN"    Latest Ref Rng & Units 06/27/2011   10:30 AM  CBC EXTENDED  WBC 4.0 - 10.5 K/uL 8.7   RBC 3.87 - 5.11 MIL/uL 4.92   Hemoglobin 12.0 - 15.0 g/dL 44.0   HCT 10.2 - 72.5 % 43.2   Platelets 150 - 400 K/uL 247      There is no height or weight on file to calculate BMI.  Orders:  Orders Placed This Encounter  Procedures   Ambulatory referral to Physical Therapy   No orders of the defined types were placed in this encounter.    Procedures: No procedures  performed  Clinical Data: No additional findings.  ROS:  All other systems negative, except as noted in the HPI. Review of Systems  Objective: Vital Signs: There were no vitals taken for this visit.  Specialty Comments:  No specialty comments available.  PMFS History: Patient Active Problem List   Diagnosis Date Noted   Post laminectomy syndrome 01/18/2018   Chronic pain syndrome 01/18/2018   HNP (herniated nucleus pulposus), lumbar 06/29/2011    Class: Diagnosis of   History of colonic polyps 02/18/2009   OBESITY 12/31/2008   GERD 12/31/2008   IBS 12/31/2008   Past Medical History:  Diagnosis Date   Arthritis    low back, HNP- L5-S1   Cataract    GERD (gastroesophageal reflux disease)    Hx of colonic polyps    Hyperlipidemia    Hypertension    stress test done 10+ yrs. ago,  done due to being on a diet pill, told that it was  wnl    Hypothyroidism    Shortness of breath     Family History  Problem Relation Age of Onset   Anesthesia problems Sister        father's sister didn't wake from anesthesia , pt.'s sister woke up slowly   Breast cancer Mother    Heart disease  Father    Breast cancer Maternal Aunt        x2   Breast cancer Paternal Aunt    Hypotension Neg Hx    Malignant hyperthermia Neg Hx    Pseudochol deficiency Neg Hx    Colon cancer Neg Hx    Stomach cancer Neg Hx    Esophageal cancer Neg Hx    Pancreatic cancer Neg Hx    Rectal cancer Neg Hx     Past Surgical History:  Procedure Laterality Date   CHOLECYSTECTOMY     2003   COLONOSCOPY     FOOT NEUROMA SURGERY     2005- R foot, "nerve snipped "   FRACTURE SURGERY     L foot, bunionectomy followed by special surgery to further stabilize the foot    LUMBAR LAMINECTOMY  06/29/2011   x2;2nd sx at [facility] Procedure: MICRODISCECTOMY LUMBAR LAMINECTOMY;  Surgeon: Adah Acron, MD;  Location: [facility];  Service: Orthopedics;  Laterality: Right;  Right L5-S1 Microdiscectomy   TUBAL LIGATION      UPPER GASTROINTESTINAL ENDOSCOPY     Social History   Occupational History   Not on file  Tobacco Use   Smoking status: Never   Smokeless tobacco: Never  Vaping Use   Vaping status: Never Used  Substance and Sexual Activity   Alcohol use: No   Drug use: No   Sexual activity: Not on file

## 2023-11-23 ENCOUNTER — Encounter: Payer: Self-pay | Admitting: Physical Therapy

## 2023-11-23 ENCOUNTER — Ambulatory Visit: Admitting: Physical Therapy

## 2023-11-23 DIAGNOSIS — R293 Abnormal posture: Secondary | ICD-10-CM | POA: Diagnosis not present

## 2023-11-23 DIAGNOSIS — M25511 Pain in right shoulder: Secondary | ICD-10-CM

## 2023-11-23 NOTE — Therapy (Signed)
 OUTPATIENT PHYSICAL THERAPY EVALUATION   Patient Name: Cynthia Werner MRN: [id number] DOB:[birth year], 78 y.o., female Today's Date: 11/23/2023  END OF SESSION:  PT End of Session - 11/23/23 1014     Visit Number 1    Number of Visits 4    Date for PT Re-Evaluation 12/21/23    Authorization Type [employer] $20 copay    Progress Note Due on Visit 10    PT Start Time 1012    PT Stop Time 1040    PT Time Calculation (min) 28 min    Activity Tolerance Patient tolerated treatment well    Behavior During Therapy WFL for tasks assessed/performed          Past Medical History:  Diagnosis Date   Arthritis    low back, HNP- L5-S1   Cataract    GERD (gastroesophageal reflux disease)    Hx of colonic polyps    Hyperlipidemia    Hypertension    stress test done 10+ yrs. ago,  done due to being on a diet pill, told that it was  wnl    Hypothyroidism    Shortness of breath    Past Surgical History:  Procedure Laterality Date   CHOLECYSTECTOMY     2003   COLONOSCOPY     FOOT NEUROMA SURGERY     2005- R foot, nerve snipped    FRACTURE SURGERY     L foot, bunionectomy followed by special surgery to further stabilize the foot    LUMBAR LAMINECTOMY  06/29/2011   x2;2nd sx at [facility] Procedure: MICRODISCECTOMY LUMBAR LAMINECTOMY;  Surgeon: Oneil JAYSON Herald, MD;  Location: [facility] OR;  Service: Orthopedics;  Laterality: Right;  Right L5-S1 Microdiscectomy   TUBAL LIGATION     UPPER GASTROINTESTINAL ENDOSCOPY     Patient Active Problem List   Diagnosis Date Noted   Post laminectomy syndrome 01/18/2018   Chronic pain syndrome 01/18/2018   HNP (herniated nucleus pulposus), lumbar 06/29/2011    Class: Diagnosis of   History of colonic polyps 02/18/2009   OBESITY 12/31/2008   GERD 12/31/2008   IBS 12/31/2008    PCP: Montey Lot, PA-C  REFERRING PROVIDER: Persons, Ronal Dragon, PA  REFERRING DIAG: [phone] (ICD-10-CM) - Chronic right shoulder pain  Rationale for Evaluation and  Treatment: Rehabilitation  THERAPY DIAG:  Acute pain of right shoulder - Plan: PT plan of care cert/re-cert  Abnormal posture - Plan: PT plan of care cert/re-cert  ONSET DATE: 2 months ago   SUBJECTIVE:  SUBJECTIVE STATEMENT: Pt reports Rt shoulder pain, which has improved with medication and injection.  Pain started about 2 months ago without known injury. She has not taken meloxicam  in over a week and pain has not returned  PERTINENT HISTORY:  OA, HTN, L5/S1 laminectomy (2013)  PAIN:  Are you having pain? Yes: NPRS scale: 0, no pain for at least a week Pain location: Rt shoulder Pain description: n/a Aggravating factors: moving Rt arm, holding anything with weight, reaching overhead Relieving factors: avoiding provoking factors, medication  PRECAUTIONS:  None  RED FLAGS: None   WEIGHT BEARING RESTRICTIONS:  No  FALLS:  Has patient fallen in last 6 months? 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  DIAGNOSTIC FINDINGS:  X-rays: negative  PATIENT SURVEYS:  Patient-Specific Activity Scoring Scheme  0 represents "unable to perform." 10 represents "able to perform at prior level. 0 1 2 3 4 5 6 7 8 9  10 (Date and Score)   Activity Eval     1. No difficulty with activities at this time      Score N/A    Total score = sum of the activity scores/number of activities Minimum detectable change (90%CI) for  average score = 2 points Minimum detectable change (90%CI) for single activity score = 3 points    COGNITIVE STATUS: Within functional limits for tasks assessed   SENSATION: WFL  POSTURE:  rounded shoulders and forward head  HAND DOMINANCE:  Right  GAIT: 11/23/23 Comments: independent   UPPER EXTREMITY ROM:  11/23/23: Bil shoulders WFL    UPPER EXTREMITY MMT:  MMT Right eval Left eval  Shoulder flexion 4/5 5/5  Shoulder extension    Shoulder abduction 3/5 4/5  Shoulder adduction    Shoulder extension    Shoulder internal rotation 5/5 5/5  Shoulder external rotation 4/5 4/5   (Blank rows = not tested)     TREATMENT:                                                                                                                              DATE:  11/23/23 TherEx See HEP - demonstrated with trial reps performed PRN, mod cues for comprehension    Self Care Educated on clinical findings and PT POC     PATIENT EDUCATION:  Education details: HEP Person educated: Patient Education method: Programmer, multimedia, Facilities manager, and Handouts Education comprehension: verbalized understanding, returned demonstration, and needs further education  HOME EXERCISE PROGRAM: Access Code: [email or web address] URL: [email or web address] Date: 11/23/2023 Prepared by: Corean Ku  Program Notes HIIT (High intensity interval training) for seniors on You Tube  Exercises -  Standing Shoulder Flexion to 90 Degrees with Dumbbells  - 1 x daily - 3-4 x weekly - 3 sets - 10 reps - Shoulder Abduction with Dumbbells - Thumbs Up  - 1 x daily - 3-4 x weekly - 3 sets - 10 reps - Shoulder External Rotation with Dumbbells  - 1 x daily - 3-4 x weekly - 3 sets - 10 reps - Shoulder Overhead Press in Flexion with Dumbbells  - 1 x daily - 3-4 x weekly - 3 sets - 10 reps - Standing Bent Over Bilateral Shoulder Row with Dumbbells  - 1 x daily - 3-4 x weekly - 3 sets - 10  reps   ASSESSMENT:  CLINICAL IMPRESSION: Patient is a 78 y.o. female who was seen today for physical therapy evaluation and treatment for Rt shoulder pain which is resolved at this time. She demonstrates mild strength deficits and postural abnormalities which were affecting functional mobility.  Provided HEP and pt to follow up if pain returns.     OBJECTIVE IMPAIRMENTS: decreased strength, postural dysfunction, and pain.   ACTIVITY LIMITATIONS: lifting and reach over head  PARTICIPATION LIMITATIONS: meal prep, cleaning, laundry, shopping, community activity, and occupation  PERSONAL FACTORS: 3+ comorbidities: OA, HTN, L5/S1 laminectomy (2013) are also affecting patient's functional outcome.   REHAB POTENTIAL: Good  CLINICAL DECISION MAKING: Stable/uncomplicated  EVALUATION COMPLEXITY: Low   GOALS: Goals reviewed with patient? Yes   LONG TERM GOALS: Target date: 12/21/2023  To be determined if pt returns Goal status: INITIAL      PLAN:  PT FREQUENCY: 1x/week (anticipate 1x eval, will see up to 1x/wk PRN)  PT DURATION: 4 weeks  PLANNED INTERVENTIONS: 97164- PT Re-evaluation, 97750- Physical Performance Testing, 97110-Therapeutic exercises, 97530- Therapeutic activity, [id number]- Neuromuscular re-education, 97535- Self Care, 02859- Manual therapy, [id number]- Aquatic Therapy, H9716- Electrical stimulation (unattended), 20560 (1-2 muscles), 20561 (3+ muscles)- Dry Needling, Patient/Family education, Taping, Cryotherapy, and Moist heat.  PLAN FOR NEXT SESSION: Review HEP if pt returns, will need goals written, reassess if needed   NEXT MD VISIT: PRN   Corean JULIANNA Ku, PT, DPT 11/23/23 11:04 AM

## 2023-12-28 DIAGNOSIS — L821 Other seborrheic keratosis: Secondary | ICD-10-CM | POA: Diagnosis not present

## 2023-12-28 DIAGNOSIS — D225 Melanocytic nevi of trunk: Secondary | ICD-10-CM | POA: Diagnosis not present

## 2023-12-28 DIAGNOSIS — L570 Actinic keratosis: Secondary | ICD-10-CM | POA: Diagnosis not present

## 2023-12-28 DIAGNOSIS — L814 Other melanin hyperpigmentation: Secondary | ICD-10-CM | POA: Diagnosis not present

## 2023-12-28 DIAGNOSIS — L578 Other skin changes due to chronic exposure to nonionizing radiation: Secondary | ICD-10-CM | POA: Diagnosis not present

## 2024-01-09 DIAGNOSIS — E7800 Pure hypercholesterolemia, unspecified: Secondary | ICD-10-CM | POA: Diagnosis not present

## 2024-01-09 DIAGNOSIS — K219 Gastro-esophageal reflux disease without esophagitis: Secondary | ICD-10-CM | POA: Diagnosis not present

## 2024-01-09 DIAGNOSIS — E538 Deficiency of other specified B group vitamins: Secondary | ICD-10-CM | POA: Diagnosis not present

## 2024-01-09 DIAGNOSIS — R7303 Prediabetes: Secondary | ICD-10-CM | POA: Diagnosis not present

## 2024-01-09 DIAGNOSIS — E039 Hypothyroidism, unspecified: Secondary | ICD-10-CM | POA: Diagnosis not present

## 2024-01-09 DIAGNOSIS — Z139 Encounter for screening, unspecified: Secondary | ICD-10-CM | POA: Diagnosis not present

## 2024-01-09 DIAGNOSIS — I10 Essential (primary) hypertension: Secondary | ICD-10-CM | POA: Diagnosis not present

## 2024-01-09 DIAGNOSIS — Z9181 History of falling: Secondary | ICD-10-CM | POA: Diagnosis not present

## 2024-01-09 DIAGNOSIS — M545 Low back pain, unspecified: Secondary | ICD-10-CM | POA: Diagnosis not present

## 2024-02-16 DIAGNOSIS — R7303 Prediabetes: Secondary | ICD-10-CM | POA: Diagnosis not present

## 2024-02-16 DIAGNOSIS — K9189 Other postprocedural complications and disorders of digestive system: Secondary | ICD-10-CM | POA: Diagnosis not present

## 2024-02-16 DIAGNOSIS — R197 Diarrhea, unspecified: Secondary | ICD-10-CM | POA: Diagnosis not present

## 2024-02-16 DIAGNOSIS — K589 Irritable bowel syndrome without diarrhea: Secondary | ICD-10-CM | POA: Diagnosis not present

## 2024-04-01 ENCOUNTER — Encounter: Payer: Self-pay | Admitting: Radiology

## 2024-04-09 DIAGNOSIS — Z961 Presence of intraocular lens: Secondary | ICD-10-CM | POA: Diagnosis not present

## 2024-04-09 DIAGNOSIS — H04123 Dry eye syndrome of bilateral lacrimal glands: Secondary | ICD-10-CM | POA: Diagnosis not present

## 2024-04-09 DIAGNOSIS — H524 Presbyopia: Secondary | ICD-10-CM | POA: Diagnosis not present

## 2024-06-25 ENCOUNTER — Encounter: Admitting: Physician Assistant

## 2024-07-02 ENCOUNTER — Encounter: Payer: Self-pay | Admitting: Physician Assistant

## 2024-07-02 ENCOUNTER — Ambulatory Visit: Admitting: Physician Assistant

## 2024-07-02 ENCOUNTER — Other Ambulatory Visit: Payer: Self-pay

## 2024-07-02 DIAGNOSIS — M5126 Other intervertebral disc displacement, lumbar region: Secondary | ICD-10-CM

## 2024-07-02 DIAGNOSIS — M545 Low back pain, unspecified: Secondary | ICD-10-CM | POA: Insufficient documentation

## 2024-07-02 NOTE — Progress Notes (Signed)
 "  Office Visit Note   Patient: Cynthia Werner           Date of Birth: 10-12-1945           MRN: [id number] Visit Date: 07/02/2024              Requested by: Montey Lot, PA-C [address] [place],  [place] [address] PCP: Montey Lot, PA-C   Assessment & Plan: Visit Diagnoses:  1. HNP (herniated nucleus pulposus), lumbar   2. Acute right-sided low back pain, unspecified whether sciatica present   3. Lumbar pain     Plan: Pleasant 79 year old woman no history of injury but has a long history of low back issues has had surgery x 2 once by Dr. Barbarann in most recent surgery about 10 years ago done at [facility].  She thinks she did too much over Thanksgiving and Christmas.  She had increased numbness going down her right hip along the lateral side of her right leg.  Has been taking Tylenol  and doing a little bit better.  I do not think she needs to be on steroids at this point but she is willing to go back to physical therapy and learn good back exercises that she can do on her own.  May follow-up with me as needed.  She also recently had an increase in her Lyrica dose which seems to help  Follow-Up Instructions: No follow-ups on file.   Orders:  Orders Placed This Encounter  Procedures   XR Lumbar Spine 2-3 Views   No orders of the defined types were placed in this encounter.     Procedures: No procedures performed   Clinical Data: No additional findings.   Subjective: No chief complaint on file.   HPI Patient is a 79 year old woman comes in today with right leg numbing and tingling.  Radiates lower back to hip.  She has had 2 surgeries on her lower back.  She is status post lower back surgeries x 2 1 done at [facility] in which L4-5 L5-S1 decompression laminectomies were performed and in 2013 she underwent a microdiscectomy with Dr. Barbarann. Review of Systems  All other systems reviewed and are negative.    Objective: Vital Signs: There were no vitals taken for this  visit.  Physical Exam Pulmonary:     Effort: Pulmonary effort is normal.  Skin:    General: Skin is warm and dry.  Neurological:     General: No focal deficit present.     Mental Status: She is alert and oriented to person, place, and time.  Psychiatric:        Mood and Affect: Mood normal.     Ortho Exam Examination of her low back she has well-healed surgical scars no redness no erythema no fluctuance no tenderness to palpation.  She has good strength with dorsiflexion plantarflexion extension flexion of her legs.  Negative straight leg raise.  She does have altered sensation which she cannot quite describe as burning that goes down the lateral side of her thigh negative [place]' sign Specialty Comments:  No specialty comments available.  Imaging: No results found.   PMFS History: Patient Active Problem List   Diagnosis Date Noted   Low back pain 07/02/2024   Post laminectomy syndrome 01/18/2018   Chronic pain syndrome 01/18/2018   HNP (herniated nucleus pulposus), lumbar 06/29/2011    Class: Diagnosis of   History of colonic polyps 02/18/2009   OBESITY 12/31/2008   GERD 12/31/2008   IBS  12/31/2008   Past Medical History:  Diagnosis Date   Arthritis    low back, HNP- L5-S1   Cataract    GERD (gastroesophageal reflux disease)    Hx of colonic polyps    Hyperlipidemia    Hypertension    stress test done 10+ yrs. ago,  done due to being on a diet pill, told that it was  wnl    Hypothyroidism    Shortness of breath     Family History  Problem Relation Age of Onset   Anesthesia problems Sister        father's sister didn't wake from anesthesia , pt.'s sister woke up slowly   Breast cancer Mother    Heart disease Father    Breast cancer Maternal Aunt        x2   Breast cancer Paternal Aunt    Hypotension Neg Hx    Malignant hyperthermia Neg Hx    Pseudochol deficiency Neg Hx    Colon cancer Neg Hx    Stomach cancer Neg Hx    Esophageal cancer Neg Hx     Pancreatic cancer Neg Hx    Rectal cancer Neg Hx     Past Surgical History:  Procedure Laterality Date   CHOLECYSTECTOMY     2003   COLONOSCOPY     FOOT NEUROMA SURGERY     2005- R foot, nerve snipped    FRACTURE SURGERY     L foot, bunionectomy followed by special surgery to further stabilize the foot    LUMBAR LAMINECTOMY  06/29/2011   x2;2nd sx at [facility] Procedure: MICRODISCECTOMY LUMBAR LAMINECTOMY;  Surgeon: Oneil JAYSON Herald, MD;  Location: [facility] OR;  Service: Orthopedics;  Laterality: Right;  Right L5-S1 Microdiscectomy   TUBAL LIGATION     UPPER GASTROINTESTINAL ENDOSCOPY     Social History   Occupational History   Not on file  Tobacco Use   Smoking status: Never   Smokeless tobacco: Never  Vaping Use   Vaping status: Never Used  Substance and Sexual Activity   Alcohol use: No   Drug use: No   Sexual activity: Not on file        "

## 2024-07-16 ENCOUNTER — Ambulatory Visit: Admitting: Physical Therapy
